# Patient Record
Sex: Male | Born: 1955 | Race: Asian | Hispanic: No | Marital: Single | Smoking: Never smoker
Health system: Southern US, Community
[De-identification: ages and names within clinical notes are randomized; demographics above are authoritative.]

## PROBLEM LIST (undated history)

## (undated) DIAGNOSIS — Z941 Heart transplant status: Secondary | ICD-10-CM

## (undated) DIAGNOSIS — G1221 Amyotrophic lateral sclerosis: Secondary | ICD-10-CM

## (undated) DIAGNOSIS — I509 Heart failure, unspecified: Secondary | ICD-10-CM

## (undated) DIAGNOSIS — I10 Essential (primary) hypertension: Secondary | ICD-10-CM

## (undated) HISTORY — PX: HEART TRANSPLANT: SHX268

## (undated) HISTORY — PX: ORCHIECTOMY: SHX2116

---

## 2009-07-01 ENCOUNTER — Inpatient Hospital Stay (HOSPITAL_COMMUNITY): Admission: EM | Admit: 2009-07-01 | Discharge: 2009-07-02 | Payer: Self-pay | Admitting: Emergency Medicine

## 2009-07-01 ENCOUNTER — Ambulatory Visit: Payer: Self-pay | Admitting: Pulmonary Disease

## 2009-07-02 ENCOUNTER — Encounter: Payer: Self-pay | Admitting: Cardiovascular Disease

## 2009-07-03 ENCOUNTER — Ambulatory Visit (HOSPITAL_COMMUNITY): Admission: RE | Admit: 2009-07-03 | Discharge: 2009-07-03 | Payer: Self-pay | Admitting: Pulmonary Disease

## 2009-08-22 ENCOUNTER — Emergency Department (HOSPITAL_COMMUNITY): Admission: EM | Admit: 2009-08-22 | Discharge: 2009-08-22 | Payer: Self-pay | Admitting: Emergency Medicine

## 2010-02-08 ENCOUNTER — Emergency Department (HOSPITAL_COMMUNITY): Admission: EM | Admit: 2010-02-08 | Discharge: 2010-02-09 | Payer: Self-pay | Admitting: Emergency Medicine

## 2010-02-11 ENCOUNTER — Emergency Department (HOSPITAL_COMMUNITY): Admission: EM | Admit: 2010-02-11 | Discharge: 2010-02-11 | Payer: Self-pay | Admitting: Emergency Medicine

## 2010-02-22 ENCOUNTER — Emergency Department (HOSPITAL_COMMUNITY): Admission: EM | Admit: 2010-02-22 | Discharge: 2010-02-23 | Payer: Self-pay | Admitting: Emergency Medicine

## 2010-03-30 ENCOUNTER — Emergency Department (HOSPITAL_COMMUNITY): Admission: EM | Admit: 2010-03-30 | Discharge: 2010-03-30 | Payer: Self-pay | Admitting: Emergency Medicine

## 2010-05-01 ENCOUNTER — Emergency Department (HOSPITAL_COMMUNITY): Admission: EM | Admit: 2010-05-01 | Discharge: 2010-05-01 | Payer: Self-pay | Admitting: Emergency Medicine

## 2011-01-23 LAB — CULTURE, BLOOD (ROUTINE X 2): Culture: NO GROWTH

## 2011-01-23 LAB — CBC
HCT: 42.6 % (ref 39.0–52.0)
MCH: 27.9 pg (ref 26.0–34.0)
MCHC: 32.9 g/dL (ref 30.0–36.0)
MCV: 84.8 fL (ref 78.0–100.0)
Platelets: 182 10*3/uL (ref 150–400)
RBC: 5.02 MIL/uL (ref 4.22–5.81)
RDW: 18.7 % — ABNORMAL HIGH (ref 11.5–15.5)
WBC: 8.2 10*3/uL (ref 4.0–10.5)

## 2011-01-23 LAB — URINALYSIS, ROUTINE W REFLEX MICROSCOPIC
Hgb urine dipstick: NEGATIVE
Nitrite: NEGATIVE
Specific Gravity, Urine: 1.013 (ref 1.005–1.030)
Urobilinogen, UA: 2 mg/dL — ABNORMAL HIGH (ref 0.0–1.0)

## 2011-01-23 LAB — TROPONIN I: Troponin I: 0.03 ng/mL (ref 0.00–0.06)

## 2011-01-23 LAB — DIFFERENTIAL: Lymphs Abs: 0.5 10*3/uL — ABNORMAL LOW (ref 0.7–4.0)

## 2011-01-23 LAB — URINE CULTURE
Colony Count: NO GROWTH
Culture: NO GROWTH

## 2011-01-23 LAB — BASIC METABOLIC PANEL
BUN: 26 mg/dL — ABNORMAL HIGH (ref 6–23)
CO2: 28 mEq/L (ref 19–32)
Calcium: 9.2 mg/dL (ref 8.4–10.5)
Creatinine, Ser: 1.81 mg/dL — ABNORMAL HIGH (ref 0.4–1.5)
Sodium: 138 mEq/L (ref 135–145)

## 2011-01-23 LAB — PROTIME-INR: Prothrombin Time: 21.5 seconds — ABNORMAL HIGH (ref 11.6–15.2)

## 2011-01-24 LAB — CBC
HCT: 41.8 % (ref 39.0–52.0)
Hemoglobin: 13.8 g/dL (ref 13.0–17.0)
MCV: 84 fL (ref 78.0–100.0)
RBC: 4.98 MIL/uL (ref 4.22–5.81)
RDW: 18.5 % — ABNORMAL HIGH (ref 11.5–15.5)
WBC: 15.1 10*3/uL — ABNORMAL HIGH (ref 4.0–10.5)

## 2011-01-24 LAB — DIFFERENTIAL
Basophils Absolute: 0 10*3/uL (ref 0.0–0.1)
Basophils Relative: 0 % (ref 0–1)
Eosinophils Absolute: 0 10*3/uL (ref 0.0–0.7)
Eosinophils Relative: 0 % (ref 0–5)
Lymphocytes Relative: 3 % — ABNORMAL LOW (ref 12–46)
Lymphs Abs: 0.4 10*3/uL — ABNORMAL LOW (ref 0.7–4.0)
Monocytes Absolute: 1.2 10*3/uL — ABNORMAL HIGH (ref 0.1–1.0)

## 2011-01-24 LAB — POCT I-STAT, CHEM 8
BUN: 26 mg/dL — ABNORMAL HIGH (ref 6–23)
Calcium, Ion: 0.92 mmol/L — ABNORMAL LOW (ref 1.12–1.32)
Chloride: 105 mEq/L (ref 96–112)
Creatinine, Ser: 2.2 mg/dL — ABNORMAL HIGH (ref 0.4–1.5)
Glucose, Bld: 102 mg/dL — ABNORMAL HIGH (ref 70–99)
TCO2: 32 mmol/L (ref 0–100)

## 2011-01-24 LAB — POCT CARDIAC MARKERS: Troponin i, poc: <0.05 ng/mL (ref 0.00–0.09)

## 2011-01-25 LAB — COMPREHENSIVE METABOLIC PANEL
AST: 55 U/L — ABNORMAL HIGH (ref 0–37)
Albumin: 3.4 g/dL — ABNORMAL LOW (ref 3.5–5.2)
Alkaline Phosphatase: 115 U/L (ref 39–117)
BUN: 29 mg/dL — ABNORMAL HIGH (ref 6–23)
CO2: 28 mEq/L (ref 19–32)
Chloride: 103 mEq/L (ref 96–112)
Creatinine, Ser: 1.85 mg/dL — ABNORMAL HIGH (ref 0.4–1.5)
GFR calc non Af Amer: 38 mL/min — ABNORMAL LOW (ref >60)
Potassium: 5.2 mEq/L — ABNORMAL HIGH (ref 3.5–5.1)
Total Bilirubin: 0.9 mg/dL (ref 0.3–1.2)

## 2011-01-25 LAB — DIFFERENTIAL
Basophils Absolute: 0 10*3/uL (ref 0.0–0.1)
Basophils Relative: 0 % (ref 0–1)
Eosinophils Relative: 0 % (ref 0–5)
Lymphocytes Relative: 4 % — ABNORMAL LOW (ref 12–46)
Monocytes Absolute: 0.3 10*3/uL (ref 0.1–1.0)
Neutro Abs: 9.7 10*3/uL — ABNORMAL HIGH (ref 1.7–7.7)

## 2011-01-25 LAB — CBC
HCT: 41 % (ref 39.0–52.0)
Hemoglobin: 13.7 g/dL (ref 13.0–17.0)
MCV: 86.9 fL (ref 78.0–100.0)
Platelets: 146 10*3/uL — ABNORMAL LOW (ref 150–400)
RBC: 4.72 MIL/uL (ref 4.22–5.81)
WBC: 10.4 10*3/uL (ref 4.0–10.5)

## 2011-01-25 LAB — PROTIME-INR: Prothrombin Time: 19.7 seconds — ABNORMAL HIGH (ref 11.6–15.2)

## 2011-01-25 LAB — POCT CARDIAC MARKERS: Troponin i, poc: <0.05 ng/mL (ref 0.00–0.09)

## 2011-01-26 LAB — CBC
HCT: 43.7 % (ref 39.0–52.0)
Hemoglobin: 14.2 g/dL (ref 13.0–17.0)
MCHC: 32.6 g/dL (ref 30.0–36.0)
MCV: 85.3 fL (ref 78.0–100.0)
MCV: 86.6 fL (ref 78.0–100.0)
Platelets: 173 10*3/uL (ref 150–400)
RBC: 5.04 MIL/uL (ref 4.22–5.81)
WBC: 9 10*3/uL (ref 4.0–10.5)
WBC: 9.9 10*3/uL (ref 4.0–10.5)

## 2011-01-26 LAB — DIFFERENTIAL
Eosinophils Absolute: 0 10*3/uL (ref 0.0–0.7)
Eosinophils Absolute: 0 10*3/uL (ref 0.0–0.7)
Eosinophils Relative: 0 % (ref 0–5)
Lymphs Abs: 0.3 10*3/uL — ABNORMAL LOW (ref 0.7–4.0)
Lymphs Abs: 0.6 10*3/uL — ABNORMAL LOW (ref 0.7–4.0)
Monocytes Absolute: 0.5 10*3/uL (ref 0.1–1.0)
Monocytes Relative: 6 % (ref 3–12)
Neutro Abs: 9.3 10*3/uL — ABNORMAL HIGH (ref 1.7–7.7)
Neutrophils Relative %: 94 % — ABNORMAL HIGH (ref 43–77)

## 2011-01-26 LAB — POCT I-STAT, CHEM 8
Chloride: 107 mEq/L (ref 96–112)
Creatinine, Ser: 1.6 mg/dL — ABNORMAL HIGH (ref 0.4–1.5)
HCT: 48 % (ref 39.0–52.0)
Hemoglobin: 16.3 g/dL (ref 13.0–17.0)
Potassium: 4.3 mEq/L (ref 3.5–5.1)
Sodium: 140 mEq/L (ref 135–145)

## 2011-01-26 LAB — POCT CARDIAC MARKERS
CKMB, poc: 1 ng/mL (ref 1.0–8.0)
CKMB, poc: 1.1 ng/mL (ref 1.0–8.0)
Myoglobin, poc: 36.8 ng/mL (ref 12–200)
Myoglobin, poc: 39.5 ng/mL (ref 12–200)
Myoglobin, poc: 51.3 ng/mL (ref 12–200)
Troponin i, poc: <0.05 ng/mL (ref 0.00–0.09)

## 2011-01-26 LAB — BASIC METABOLIC PANEL
CO2: 26 mEq/L (ref 19–32)
Chloride: 104 mEq/L (ref 96–112)
GFR calc Af Amer: 49 mL/min — ABNORMAL LOW (ref >60)
Potassium: 4.2 mEq/L (ref 3.5–5.1)

## 2011-02-10 LAB — PROTIME-INR
INR: 1.85 — ABNORMAL HIGH (ref 0.00–1.49)
Prothrombin Time: 21.2 s — ABNORMAL HIGH (ref 11.6–15.2)

## 2011-02-12 LAB — POCT I-STAT, CHEM 8
BUN: 25 mg/dL — ABNORMAL HIGH (ref 6–23)
BUN: 36 mg/dL — ABNORMAL HIGH (ref 6–23)
Calcium, Ion: 1.12 mmol/L (ref 1.12–1.32)
Chloride: 110 mEq/L (ref 96–112)
Creatinine, Ser: 1.4 mg/dL (ref 0.4–1.5)
Glucose, Bld: 106 mg/dL — ABNORMAL HIGH (ref 70–99)
HCT: 51 % (ref 39.0–52.0)
Sodium: 143 mEq/L (ref 135–145)
TCO2: 18 mmol/L (ref 0–100)
TCO2: 28 mmol/L (ref 0–100)

## 2011-02-12 LAB — CBC
HCT: 41.8 % (ref 39.0–52.0)
HCT: 43.3 % (ref 39.0–52.0)
HCT: 51 % (ref 39.0–52.0)
Hemoglobin: 13.6 g/dL (ref 13.0–17.0)
MCHC: 32.7 g/dL (ref 30.0–36.0)
MCV: 87.9 fL (ref 78.0–100.0)
Platelets: 114 10*3/uL — ABNORMAL LOW (ref 150–400)
Platelets: 124 10*3/uL — ABNORMAL LOW (ref 150–400)
RBC: 4.71 MIL/uL (ref 4.22–5.81)
RDW: 14.3 % (ref 11.5–15.5)
RDW: 14.4 % (ref 11.5–15.5)
RDW: 14.4 % (ref 11.5–15.5)
WBC: 10.5 10*3/uL (ref 4.0–10.5)

## 2011-02-12 LAB — BASIC METABOLIC PANEL
BUN: 23 mg/dL (ref 6–23)
CO2: 15 mEq/L — ABNORMAL LOW (ref 19–32)
CO2: 15 mEq/L — ABNORMAL LOW (ref 19–32)
Chloride: 102 mEq/L (ref 96–112)
Chloride: 112 mEq/L (ref 96–112)
GFR calc Af Amer: 53 mL/min — ABNORMAL LOW (ref >60)
GFR calc Af Amer: 53 mL/min — ABNORMAL LOW (ref >60)
GFR calc non Af Amer: 41 mL/min — ABNORMAL LOW (ref >60)
GFR calc non Af Amer: 44 mL/min — ABNORMAL LOW (ref >60)
Glucose, Bld: 220 mg/dL — ABNORMAL HIGH (ref 70–99)
Potassium: 3 mEq/L — ABNORMAL LOW (ref 3.5–5.1)
Potassium: 3.5 mEq/L (ref 3.5–5.1)
Potassium: 4.2 mEq/L (ref 3.5–5.1)
Sodium: 127 mEq/L — ABNORMAL LOW (ref 135–145)
Sodium: 138 mEq/L (ref 135–145)
Sodium: 140 mEq/L (ref 135–145)

## 2011-02-12 LAB — GLUCOSE, CAPILLARY: Glucose-Capillary: 115 mg/dL — ABNORMAL HIGH (ref 70–99)

## 2011-02-12 LAB — BLOOD GAS, ARTERIAL
Acid-base deficit: 10.4 mmol/L — ABNORMAL HIGH (ref 0.0–2.0)
Acid-base deficit: 5.4 mmol/L — ABNORMAL HIGH (ref 0.0–2.0)
Acid-base deficit: 8.9 mmol/L — ABNORMAL HIGH (ref 0.0–2.0)
Bicarbonate: 16.4 mEq/L — ABNORMAL LOW (ref 20.0–24.0)
Drawn by: 277341
Drawn by: 31606
Drawn by: 316061
Drawn by: 316061
FIO2: 1 %
FIO2: 1 %
MECHVT: 600 mL
MECHVT: 600 mL
O2 Saturation: 97 %
O2 Saturation: 99.6 %
PEEP: 5 cmH2O
PEEP: 8 cmH2O
Patient temperature: 98.6
RATE: 15 resp/min
RATE: 24 resp/min
RATE: 24 resp/min
RATE: 24 resp/min
pCO2 arterial: 23.2 mmHg — ABNORMAL LOW (ref 35.0–45.0)
pCO2 arterial: 32.1 mmHg — ABNORMAL LOW (ref 35.0–45.0)
pH, Arterial: 7.385 (ref 7.350–7.450)
pH, Arterial: 7.421 (ref 7.350–7.450)
pO2, Arterial: 89.2 mmHg (ref 80.0–100.0)

## 2011-02-12 LAB — CULTURE, BLOOD (ROUTINE X 2)

## 2011-02-12 LAB — DIFFERENTIAL
Basophils Absolute: 0 10*3/uL (ref 0.0–0.1)
Lymphocytes Relative: 17 % (ref 12–46)
Lymphs Abs: 0.9 10*3/uL (ref 0.7–4.0)
Monocytes Relative: 4 % (ref 3–12)

## 2011-02-12 LAB — TROPONIN I: Troponin I: 0.89 ng/mL (ref 0.00–0.06)

## 2011-02-12 LAB — POCT CARDIAC MARKERS: Troponin i, poc: <0.05 ng/mL (ref 0.00–0.09)

## 2011-02-12 LAB — HEMOGLOBIN A1C: Mean Plasma Glucose: 131 mg/dL

## 2011-02-12 LAB — URINE CULTURE: Colony Count: NO GROWTH

## 2011-02-12 LAB — HEPARIN LEVEL (UNFRACTIONATED)
Heparin Unfractionated: 0.62 IU/mL (ref 0.30–0.70)
Heparin Unfractionated: 0.65 IU/mL (ref 0.30–0.70)

## 2011-02-12 LAB — PROTIME-INR
INR: 1.5 (ref 0.00–1.49)
INR: 1.5 (ref 0.00–1.49)
Prothrombin Time: 17.8 seconds — ABNORMAL HIGH (ref 11.6–15.2)
Prothrombin Time: 17.8 seconds — ABNORMAL HIGH (ref 11.6–15.2)
Prothrombin Time: 18.3 seconds — ABNORMAL HIGH (ref 11.6–15.2)

## 2011-02-12 LAB — URINALYSIS, MICROSCOPIC ONLY
Bilirubin Urine: NEGATIVE
Glucose, UA: NEGATIVE mg/dL
Specific Gravity, Urine: 1.017 (ref 1.005–1.030)

## 2011-02-12 LAB — COMPREHENSIVE METABOLIC PANEL
BUN: 25 mg/dL — ABNORMAL HIGH (ref 6–23)
CO2: 27 mEq/L (ref 19–32)
Chloride: 103 mEq/L (ref 96–112)
Creatinine, Ser: 1.61 mg/dL — ABNORMAL HIGH (ref 0.4–1.5)
GFR calc non Af Amer: 45 mL/min — ABNORMAL LOW (ref >60)
Glucose, Bld: 113 mg/dL — ABNORMAL HIGH (ref 70–99)
Total Bilirubin: 1 mg/dL (ref 0.3–1.2)

## 2011-02-12 LAB — URINE DRUGS OF ABUSE SCREEN W ALC, ROUTINE (REF LAB)
Cocaine Metabolites: NEGATIVE
Creatinine,U: 85.5 mg/dL
Ethyl Alcohol: <10 mg/dL (ref <10)
Opiate Screen, Urine: NEGATIVE
Phencyclidine (PCP): NEGATIVE

## 2011-02-12 LAB — HEPATIC FUNCTION PANEL
AST: 65 U/L — ABNORMAL HIGH (ref 0–37)
Bilirubin, Direct: 0.2 mg/dL (ref 0.0–0.3)
Indirect Bilirubin: 0.5 mg/dL (ref 0.3–0.9)
Total Protein: 4.9 g/dL — ABNORMAL LOW (ref 6.0–8.3)

## 2011-02-12 LAB — BRAIN NATRIURETIC PEPTIDE
Pro B Natriuretic peptide (BNP): 616 pg/mL — ABNORMAL HIGH (ref 0.0–100.0)
Pro B Natriuretic peptide (BNP): 657 pg/mL — ABNORMAL HIGH (ref 0.0–100.0)

## 2011-02-12 LAB — POCT I-STAT 3, VENOUS BLOOD GAS (G3P V)
pCO2, Ven: 58.6 mmHg — ABNORMAL HIGH (ref 45.0–50.0)
pH, Ven: 7.198 — CL (ref 7.250–7.300)

## 2011-02-12 LAB — LEGIONELLA ANTIGEN, URINE

## 2011-02-12 LAB — CREATININE, SERUM
Creatinine, Ser: 1.52 mg/dL — ABNORMAL HIGH (ref 0.4–1.5)
GFR calc Af Amer: 58 mL/min — ABNORMAL LOW (ref >60)

## 2011-02-12 LAB — POCT I-STAT 3, ART BLOOD GAS (G3+)
Acid-base deficit: 11 mmol/L — ABNORMAL HIGH (ref 0.0–2.0)
O2 Saturation: 100 %
pO2, Arterial: 250 mmHg — ABNORMAL HIGH (ref 80.0–100.0)

## 2011-02-12 LAB — APTT
aPTT: 196 seconds — ABNORMAL HIGH (ref 24–37)
aPTT: 26 seconds (ref 24–37)

## 2011-02-12 LAB — CARDIAC PANEL(CRET KIN+CKTOT+MB+TROPI)
Relative Index: 2.8 — ABNORMAL HIGH (ref 0.0–2.5)
Total CK: 248 U/L — ABNORMAL HIGH (ref 7–232)
Troponin I: 0.67 ng/mL (ref 0.00–0.06)

## 2011-02-12 LAB — TSH: TSH: 2.521 u[IU]/mL (ref 0.350–4.500)

## 2011-02-12 LAB — STREP PNEUMONIAE URINARY ANTIGEN: Strep Pneumo Urinary Antigen: NEGATIVE

## 2011-02-12 LAB — MAGNESIUM: Magnesium: 1.4 mg/dL — ABNORMAL LOW (ref 1.5–2.5)

## 2011-02-12 LAB — T3, FREE: T3, Free: 3 pg/mL (ref 2.3–4.2)

## 2011-03-22 NOTE — Cardiovascular Report (Signed)
[name], [name]             ACCOUNT NO.:  [id number]   MEDICAL RECORD NO.:  [id number]          PATIENT TYPE:  INP   LOCATION:  2912                         FACILITY:  [facility]   PHYSICIAN:  Nanetta Batty, M.D.   DATE OF BIRTH:  03/03/1956   DATE OF PROCEDURE:  DATE OF DISCHARGE:  07/02/2009                            CARDIAC CATHETERIZATION   [name] is a 55 year old gentleman admitted yesterday through the  emergency room with incessant VT and cardiogenic shock.  He was shocked  multiple times and ultimately was brought to the Cath Lab where  diagnostic coronary angiography was performed by Dr. Tresa Endo revealing  essentially normal coronary arteries with severe LV dysfunction and EF  less than 10%.  Intraaortic balloon pump was placed as was Swan-Ganz  catheter.  Ultimately, his ventricular tachyarrhythmias abated and he  has been sedated and intubated, on pressors.  The intraaortic balloon  pump unfortunately malfunctioned last night and had been removed.  The  patient was accepted in transfer through [facility] for consideration of LVAD and transplant.  This balloon  pump is being reinserted for hemodynamic stability during transport.   DESCRIPTION OF PROCEDURE:  The patient was brought to the Second Floor  [facility] Cardiac Cath Lab intubated.  His right groin was prepped  sterilely.  The Swan-Ganz catheter was remained in place.  Double glove  technique was used.  An 0.035 J-wire was used to withdraw the previously  placed balloon pump sheath and this was replaced with a new 7.5 [place]  sheath.  A 40-cm long intraaortic balloon pump was inserted and  fluoroscopically placed at the level of the carina.  One-to-one counter  pulsation was then initiated with an augmented pressure in the 130  range.  The patient tolerated the procedure well.  There were no  complications.   IMPRESSION:  Successful reintroduction of intraaortic balloon  pump via  the previous puncture with sheath exchange using double glove sterile  technique.  The patient received a gram of Rocephin prophylactically.  He will be transported to [facility] for further treatment.      Nanetta Batty, M.D.  Electronically Signed     JB/MEDQ  D:  07/02/2009  T:  07/03/2009  Job:  [id number]   cc:   Second Floor [facility]  [facility]

## 2011-03-22 NOTE — Discharge Summary (Signed)
[name], [name]             ACCOUNT NO.:  [id number]   MEDICAL RECORD NO.:  [id number]          PATIENT TYPE:  INP   LOCATION:  2912                         FAC[name]   PHYSICIAN:  Reginald Iba, MD   DATE OF BIRTH:  [birth year]   DATE OF ADMISSION:  07/01/2009  DATE OF DISCHARGE:  07/02/2009                               DISCHARGE SUMMARY   DISCHARGE DIAGNOSIS:  1. Acute nonischemic cardiomyopathy with ejection  approximately 10%.  2. Sustained ventricular tachycardia with cardioversion and multiple      shocks, maintaining sinus with amiodarone drip.  3. Respiratory failure with intubation and ventilator with FiO2 of      40%, PEEP of 5 cm, tidal volume 600 mL and vent rate is 15.  4. Hypotensive on multiple pressors, Levophed and dobutamine.  5. Normal coronary arteries.  6. History of gastroesophageal reflux disease.  7. History of normal coronary arteries and ejection fraction 50-55% at      [name] December 09, 2008 by Dr. Nolberto Krueger.  8. Intra-aortic balloon pump via right femoral artery one-to-one      augmentation  9. Left femoral and femoral arterial line for blood pressure and blood      draws.   CONSULTATIONS:  Dr. Sherryl Krueger and Dr. Johney Krueger for electrophysiology  and critical care medicine for ventilator management.   DISCHARGE CONDITION:  Critical, family is aware of critical status.   HISTORY OF PRESENT ILLNESS:  A 55 year old gentleman was admitted to the  emergency room at [name], presented with complaints of chest pain  and tachycardia.   The patient was found to be in ventricular tachycardia.  IV fluids were  started and he underwent carotid massage with conversion to sinus  rhythm.   The patient's recent history includes in February he was having  tachycardia.  He went to cardiology in [name] and underwent  cardiac catheterization.  He did wear a monitor at that time but he did  not believe the tachycardia was found on the  monitor.  Usually his chest  burning would occur with the tachycardia.  The patient did well after  the cardiac catheterization in February until the beginning of August  other than a rare episode of tachycardia.  At the beginning of August,  he developed more runs of tachycardia.  He would have chest burning and  into both arms.  He would feel weak and tired with these episodes.  Sometimes the episodes lasted all day long.  The patient saw his primary  care physician because he was also developing some swelling in his lower  extremities and was put on Lasix 40 mg in addition to his Toprol and  Protonix, Toprol was 25 mg.  The patient continued with episodes.  They  attempted to get a cardiology appointment and that was to be done on  August 29, 2009.   On the day of arrival a [name], it was worsening symptoms.  He had  been at work.  He felt bad and came to the emergency room.  After the  initial conversion with carotid massage to  sinus, he went back into V-  tach with rates of 158, initially was 166.  He was started on an IV  amiodarone bolus.  His blood pressure dropped into the 80s and the  amiodarone bolus was discontinued.  The patient was alert and pressure  actually maintained in the V-tach prior to the amiodarone initially.  The patient was given 75 mg bolus of lidocaine with no change in his  arrhythmia.  He was given 5 mg IV Lopressor with no change in arrhythmia  and blood pressures at that time were around 100.  He became nauseated.  He was feeling very lightheaded, having flashing lights in front of his  eyes as well and the chest discomfort would come and go.  We attempted  another amiodarone 150 mg given much slower but blood pressure continued  to drop.  He was given as etomidate 12 mg and succinylcholine  100 mg  and was cardioverted into a severe bradycardia with rate 20.  Slowly as  heart rate came back up.  He would continue with episodes of ventricular   tachycardia.  His respirations were slow to respond it was felt due to  the severity of his condition.  He was intubated and placed on a  respirator.   The patient then had no blood pressure medicine or very low and CPR was  started which occurred for between 1 to 5 minutes of CPR.  He was given  epinephrine and he was given dobutamine drip and calcium chloride.  Then  a dopamine drip was started.  The cardiac cath lab was notified.  The  patient was transported to the cath lab for intra-aortic balloon pump.  There he was in V-tach at least five different times and shocked into  sinus rhythm.  The patient was given sedation, and Swan-Ganz catheter  was placed as well.   Additionally, Dr. Tresa Endo did do a heart catheterization as well and he  was found to have essentially normal coronary arteries.  Initial LV  readings were 58/28.   Please note, so we had done a quick look echo in the emergency room.  EF  in V-tach appeared to be 10%.  In the sinus rhythm, it did appear to be  20% but this was not a complete 2-D echo.   The patient was taken from the cath lab to the CCU and continued on  balloon pump and ventilator with critical care management.  He was on IV  dopamine and dobutamine, eventually this was changed to norepinephrine  as well as fentanyl and Versed for sedation and the amiodarone continued  during the night.  He received several boluses of IV amiodarone  secondary to ventricular tachycardia.   At one point, thought was to evaluate for lidocaine.  EP was consulted.  They felt amiodarone was the drug of choice for this patient.   The patient maintained during the night with blood pressures in the 90s  to low 100s systolic and by our arterial line.   At time of discharge, intra-aortic balloon pump will be placed.  Vital  Signs: Currently blood pressure 103/58 with pulse 74, temperature 96.8  and that is core temperature, augmented systolic pressure 90, augmenting   diastolic 49 and PA pressure 25 systolic, 17 diastolic and a mean of 21.  It has been difficult to wedge the patient on Swan and the Ernestine Conrad is  measured at 65 cm right femoral artery.  At 9:36, cardiac output is  2.99, heart  rate 82, cardiac index 1.62 and CVP 26.  Copies will be sent  with the patient.   MEDICATIONS:  IV amiodarone is at 60 mg an hour.  IV dobutamine is at 20  mcg per kilo per minute and norepinephrine instead 18 mcg per minute.  IV fentanyl is at 50 mcg per hour.  Heparin as at 700 units an hour and  Versed is at 4 mg an hour.  IV fluids:  D5W with 150 mEq of bicarb is at  75 mL an hour, normal saline at 30 mL an hour.  I and O August 25:  The  patient probably received 4000 of saline in the emergency room and that  is not included in this I and O but intake was 17, 46 output was 405 for  a positive 1341 and on August 26 intake 1341, output 167, positive 1174  at this time.   LABORATORY:  On initial cardiac marker, MB was 1.8, troponin less than  0.05, myoglobin 68 and follow up CK 112, MB 3.5, troponin 0.89 and  follow up in 8 hours, CK 248, MB 7, troponin I 0.67.  The next CK 546,  MB 10.6, troponin I 0.66.  His enzymes up not due to coronary stenosis  but due to probable myocarditis and numerous shocks.  Initial potassium  was 5.4 with a creatinine of 1.6.  After fluids were given, sodium 140,  potassium 5.1, chloride 103, CO2 27, glucose 113, BUN 25, creatinine 1.6  and on LFTs, SGOT was 48.  Again after fluids, potassium dropped to 3.3,  chloride 110, BUN 25 and creatinine 1.4 and then cardiac catheterization  was done.  Currently basic metabolic panel at 4:00 a.m. sodium 138,  potassium 4.2, chloride 111, CO2 15, glucose 220, BUN 21, creatinine  1.74.  Magnesium at 4:00 a.m. on 08/26 2.0, phosphorus 1.7, heparin  level was 0.62.  Drugs of abuse screen was negative.  Most recent blood  gas at 10:00 a.m. on 40% FiO2:  pH 7.421, pCO2 25.8 pO2 92.2, bicarb  16.4,  total CO2 17.2, oxygen saturation 97%.  BNP done August 26 of 616.  PTT 196, pro time 18.3 with INR of 1.5.   Glycohemoglobin is elevated at 6.2.  CBC on August 26 at 4 a.m.: WBC is  elevated 10.6, hemoglobin 13.6, hematocrit 41.8 and platelets 104.   T3 is 3.0.  TSH is 2.521.  He had a strep pneumonia of urine which was  negative.  UA was essentially negative, urobilinogen 0.2 protein 30.   RADIOLOGY:  Initial chest x-ray in the emergency room prior to  intubation:  No congestive heart failure or pneumonia, heart size upper  normal considering AP projection.  Follow up after intubation:  ET tube  is in place, minimal interstitial prominence bilaterally suspicious for  mild interstitial edema and no acute infiltrate or focal consolidation.  No pneumothorax and there was an NG in place and then the evening of  August 25, ET tube was stable and NG stable.  A femoral placed Swan-Ganz  catheter projects into the proximal right pulmonary artery.  Intra-  aortic balloon with its marker 8 mm inferior to the top of aortic arch.  Cardiomegaly and mild pulmonary edema appear stable.  No pneumothorax or  significant pleural effusion and then at August 26 at 5:54 a.m.  pulmonary vascular congestion and actually pulmonary edema is improved.  Balloon pump was in unchanged position, Swan-Ganz in the right main  pulmonary artery segment  and ET tube was positioned satisfactorily and  NG was satisfactory.  2-D echo has been done.   Please note for family history, no coronary disease.  One nephew has had  a rhythm problem per the family and received ICD.   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  His brothers  are quite close with the patient though they do not live with them.  He  is single.  He has a sister who is well.  His  three siblings appear to  be healthy.  The patient works for the [employer] and has worked  through most of his tachycardia through the month of August though  he  has felt very bad with it.   Outpatient meds:  Toprol XL 25 mg daily, Protonix 40 mg daily, Lasix 40  mg daily.   Other history only included gastroesophageal reflux disease and  hypertension.   Please note the patient denied any recent colds or fevers or viral  syndrome as well as his family denied that he had any illnesses.  His  sister relates over a year ago he did have shingles ,otherwise were  negative.   Please note at the time of transfer to [facility], the patient does not  follow commands when he is awakened from his sedation   Please note Dr. Garen Lah talked with the sister and one of his brothers  at length about the severity of his condition, that our hope is to  transfer him to [facility] for further management but that he is gravely  ill.  They are aware of this issue.   Please note as well the patient does have thrombocytopenia and  hyperglycemia and we will manage that prior to discharge.      Darcella Gasman. Annie Paras, N.P.      [facility] Iba, MD  Electronically Signed    LRI/MEDQ  D:  07/02/2009  T:  07/02/2009  Job:  [id number]   cc:   Dr. Kathryne Hitch

## 2011-03-22 NOTE — Cardiovascular Report (Signed)
[name], [name]             ACCOUNT NO.:  [id number]   MEDICAL RECORD NO.:  [id number]          PATIENT TYPE:  INP   LOCATION:  2912                         FACILITY:  [facility]   PHYSICIAN:  Nicki Guadalajara, M.D.     DATE OF BIRTH:  08-Feb-1956   DATE OF PROCEDURE:  DATE OF DISCHARGE:                            CARDIAC CATHETERIZATION   INDICATIONS:  [name] is a 55 year old gentleman who  apparently had undergone cardiac catheterization earlier this year at  [facility] where he was found to have normal LV function and  normal coronary arteries.  Apparently, he presented to the emergency  room today and was found to have sustained ventricular tachycardia,  shortness of breath, hypotension.  He was evaluated by Dr. Mariah Milling and  with poor perfusion with his VT.  He ultimately required, I do not know  despite amiodarone, he required transient CPR with chest compressions,  multiple countershocks.  He also was started on atrophic pressure  report, was initially dobutamine and subsequently dopamine for continued  hypotension.  A curbside brief echo assessment portable, suggested  severe LV dysfunction with an EF of approximately 10%.  He is now taken  emergently to the cardiac catheterization.   LABORATORY:  Due to his persistent hypotension despite anatrophic  therapy and continued recurrent VT.   In the emergency room, the patient also had been intubated.  He received  Versed for sedation.  His right femoral artery was punctured  anteriorly  and a balloon pump sheath was inserted and a 40-cm linear intra-aortic  balloon pump was successfully advanced into the aorta and positioned  just below the aortic knob.  Counterpulsation was begun.  Initial  pressure was 60/40.  Initial augmentation of blood pressure increased  this to 73 mm.  The patient was still on dobutamine and dopamine.  He  continued to develop recurrent episodes of ventricular tachycardia.  He  received  at least four additional shocks in the lab.  He also received,  I believe additional amiodarone and an additional 100 mg lidocaine  bolus.  His left femoral artery was punctured anteriorly and selective  angiography of native coronary arteries were performed to exclude an  ischemic etiology.  His right femoral vein was punctured and an 8-French  venous sheath was inserted.  Swan-Ganz catheterization was then  performed with hemodynamic monitoring in the RA, RV, PA and PC  positions.  O2 saturation was obtained in the central AO as well as in  the pulmonary artery for Fick cardiac output determination.  Ultimately,  at the end of the procedure following continued intra-aortic balloon  counterpulsation and central aortic pressure, finally started to  increase such that his augmented pressure had risen to approximately 100-  110 mm.  In addition, because of his initial recurrent incessant VT, EP  evaluation was also performed by Dr. Graciela Husbands.  The patient will be  transported to the coronary care unit.   Initial hemodynamic, central aortic pressure was approximately 60/40.  Initial augmentation of pressure was 73 mm systolically.   Left ventricular pressure was 58/28.   ABG was 7.16,  CO2 47, pO2 250 and bicarbonate 17.  He also received 2  amp of sodium bicarbonate.   Swan-Ganz catheterization showed a RA pressure, mean of 30, A-wave 34,  pulmonary artery pressure was difficult to read due to marked lability  in the recording.  Pulmonary capillary wedge pressure mean was 35.  The  patient left the cardiac catheterization laboratory, intubated, sedated  with augmented systolic blood pressure now 120 mm.   IMPRESSION:  1. Incessant recurrent monomorphic VT.  2. Nonischemic cardiomyopathy.  3. Cardiogenic shock, status post insertion of intra-aortic balloon      counterpulsation.  4. No significant coronary obstructive disease.           ______________________________  Nicki Guadalajara,  M.D.     TK/MEDQ  D:  07/01/2009  T:  07/02/2009  Job:  [id number]

## 2011-08-19 DIAGNOSIS — D649 Anemia, unspecified: Secondary | ICD-10-CM | POA: Diagnosis present

## 2012-02-09 DIAGNOSIS — Z941 Heart transplant status: Secondary | ICD-10-CM

## 2012-04-17 DIAGNOSIS — I10 Essential (primary) hypertension: Secondary | ICD-10-CM | POA: Diagnosis present

## 2015-01-23 DIAGNOSIS — G1221 Amyotrophic lateral sclerosis: Secondary | ICD-10-CM | POA: Diagnosis present

## 2015-05-27 ENCOUNTER — Encounter (HOSPITAL_COMMUNITY): Payer: Self-pay

## 2015-05-27 ENCOUNTER — Emergency Department (HOSPITAL_COMMUNITY): Payer: Medicare Other

## 2015-05-27 ENCOUNTER — Emergency Department (HOSPITAL_COMMUNITY)
Admission: EM | Admit: 2015-05-27 | Discharge: 2015-05-28 | Disposition: A | Discharge status: Other Acute Inpatient Hospital | Payer: Medicare Other | Attending: Emergency Medicine | Admitting: Emergency Medicine

## 2015-05-27 DIAGNOSIS — R0682 Tachypnea, not elsewhere classified: Secondary | ICD-10-CM | POA: Diagnosis not present

## 2015-05-27 DIAGNOSIS — R079 Chest pain, unspecified: Secondary | ICD-10-CM | POA: Diagnosis not present

## 2015-05-27 DIAGNOSIS — Z941 Heart transplant status: Secondary | ICD-10-CM | POA: Diagnosis not present

## 2015-05-27 DIAGNOSIS — G1221 Amyotrophic lateral sclerosis: Secondary | ICD-10-CM

## 2015-05-27 DIAGNOSIS — Z7982 Long term (current) use of aspirin: Secondary | ICD-10-CM | POA: Diagnosis not present

## 2015-05-27 DIAGNOSIS — R000 Tachycardia, unspecified: Secondary | ICD-10-CM | POA: Diagnosis not present

## 2015-05-27 DIAGNOSIS — Z79899 Other long term (current) drug therapy: Secondary | ICD-10-CM | POA: Diagnosis not present

## 2015-05-27 DIAGNOSIS — I10 Essential (primary) hypertension: Secondary | ICD-10-CM | POA: Diagnosis not present

## 2015-05-27 DIAGNOSIS — I509 Heart failure, unspecified: Secondary | ICD-10-CM | POA: Insufficient documentation

## 2015-05-27 HISTORY — DX: Essential (primary) hypertension: I10

## 2015-05-27 HISTORY — DX: Amyotrophic lateral sclerosis: G12.21

## 2015-05-27 HISTORY — DX: Heart failure, unspecified: I50.9

## 2015-05-27 HISTORY — DX: Heart transplant status: Z94.1

## 2015-05-27 LAB — CBC
HEMATOCRIT: 41.4 % (ref 39.0–52.0)
Hemoglobin: 13 g/dL (ref 13.0–17.0)
MCH: 25.8 pg — AB (ref 26.0–34.0)
MCHC: 31.4 g/dL (ref 30.0–36.0)
MCV: 82.3 fL (ref 78.0–100.0)
Platelets: 208 10*3/uL (ref 150–400)
RBC: 5.03 MIL/uL (ref 4.22–5.81)
RDW: 15.1 % (ref 11.5–15.5)
WBC: 6.6 10*3/uL (ref 4.0–10.5)

## 2015-05-27 LAB — D-DIMER, QUANTITATIVE: D-Dimer, Quant: 1.41 ug/mL-FEU — ABNORMAL HIGH (ref 0.00–0.48)

## 2015-05-27 LAB — BASIC METABOLIC PANEL
Anion gap: 9 (ref 5–15)
BUN: 23 mg/dL — AB (ref 6–20)
CO2: 31 mmol/L (ref 22–32)
CREATININE: 0.73 mg/dL (ref 0.61–1.24)
Calcium: 10.4 mg/dL — ABNORMAL HIGH (ref 8.9–10.3)
Chloride: 98 mmol/L — ABNORMAL LOW (ref 101–111)
GFR calc Af Amer: >60 mL/min (ref >60)
GFR calc non Af Amer: >60 mL/min (ref >60)
Glucose, Bld: 161 mg/dL — ABNORMAL HIGH (ref 65–99)
Potassium: 4.1 mmol/L (ref 3.5–5.1)
Sodium: 138 mmol/L (ref 135–145)

## 2015-05-27 LAB — I-STAT TROPONIN, ED: Troponin i, poc: 0 ng/mL (ref 0.00–0.08)

## 2015-05-27 MED ORDER — IOHEXOL 350 MG/ML SOLN
80.0000 mL | Freq: Once | INTRAVENOUS | Status: AC | PRN
  Administered 2015-05-27: 80 mL via INTRAVENOUS

## 2015-05-27 MED ORDER — SODIUM CHLORIDE 0.9 % IV BOLUS (SEPSIS)
500.0000 mL | Freq: Once | INTRAVENOUS | Status: AC
  Administered 2015-05-27: 500 mL via INTRAVENOUS

## 2015-05-27 NOTE — ED Notes (Signed)
MD at bedside. 

## 2015-05-27 NOTE — ED Notes (Signed)
Pt. Presents with complaint of CP intermittently today. Pt. AxO x4. Pt. 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 Pt. States his breathing has worsened over past few weeks. Pt. 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

## 2015-05-27 NOTE — ED Provider Notes (Signed)
CSN: [id number]     Arrival date & time 05/27/15  1821 History   First MD Initiated Contact with Patient 05/27/15 1908     Chief Complaint  Patient presents with  . Chest Pain     (Consider location/radiation/quality/duration/timing/severity/associated sxs/prior Treatment) HPI The patient has a history of ALS. He reports that today he has had several episodes of a sharp pain in his left anterior chest. It has been coming and going. It has resolved completely between episodes. Patient denies significant associated symptoms. He has not had fever or cough production. He does report he has had the increased heart rate and increased respiratory rate for several weeks. That has been attributed to his ALS. As a baseline since his cardiac transplant reports his heart rate has been in the 90s. For about 2 weeks now his heart rate is been the low 100s. Past Medical History  Diagnosis Date  . ALS (amyotrophic lateral sclerosis)   . Heart transplanted   . Hypertension   . CHF (congestive heart failure)    Past Surgical History  Procedure Laterality Date  . Heart transplant    . Orchiectomy      L sided   No family history on file. History  Substance Use Topics  . Smoking status: Never Smoker   . Smokeless tobacco: Not on file  . Alcohol Use: No    Review of Systems 10 Systems reviewed and are negative for acute change except as noted in the HPI.    Allergies  Review of patient's allergies indicates no known allergies.  Home Medications   Prior to Admission medications   Medication Sig Start Date End Date Taking? Authorizing Provider  amoxicillin (AMOXIL) 500 MG tablet Take 2,000 mg by mouth once. 04/14/15  Yes Historical Provider, MD  aspirin 81 MG tablet Take 81 mg by mouth every morning.   Yes Historical Provider, MD  bismuth subsalicylate (PEPTO BISMOL) 262 MG/15ML suspension Take 30 mLs by mouth every 6 (six) hours as needed for diarrhea or loose stools.    Yes Historical  Provider, MD  Calcium Carb-Cholecalciferol (CALCIUM 600/VITAMIN D3) 600-800 MG-UNIT TABS Take 1 tablet by mouth 2 (two) times daily.   Yes Historical Provider, MD  cetirizine (ZYRTEC) 10 MG tablet Take 10 mg by mouth as needed for allergies.    Yes Historical Provider, MD  diphenhydramine-acetaminophen (TYLENOL PM) 25-500 MG TABS Take 1 tablet by mouth at bedtime as needed (for sleep).    Yes Historical Provider, MD  lisinopril (PRINIVIL,ZESTRIL) 5 MG tablet Take 2.5 mg by mouth every morning. 03/23/15  Yes Historical Provider, MD  magnesium oxide (MAG-OX) 400 MG tablet Take 800 mg by mouth 3 (three) times daily. 10/13/14  Yes Historical Provider, MD  mycophenolate (CELLCEPT) 500 MG tablet Take 500 mg by mouth 2 (two) times daily. 01/27/15  Yes Historical Provider, MD  pravastatin (PRAVACHOL) 20 MG tablet Take 20 mg by mouth every evening. 10/09/14  Yes Historical Provider, MD  riluzole (RILUTEK) 50 MG tablet Take 50 mg by mouth 2 (two) times daily. 04/16/15  Yes Historical Provider, MD  tacrolimus (PROGRAF) 1 MG capsule Take 1.5 mg by mouth 2 (two) times daily.   Yes Historical Provider, MD   BP 135/63 mmHg  Pulse 103  Temp(Src) 98 F (36.7 C) (Oral)  Resp 27  Wt 115 lb (52.164 kg)  SpO2 97% Physical Exam  Constitutional: He is oriented to person, place, and time.  Patient is alert and appropriate. He has increased work  of breathing. He is not toxic with clear mental status.  HENT:  Head: Normocephalic and atraumatic.  Nose: Nose normal.  Mouth/Throat: Oropharynx is clear and moist.  Eyes: EOM are normal. Pupils are equal, round, and reactive to light.  Neck: Neck supple.  Cardiovascular: Intact distal pulses.   Tachycardia. No gross rub murmur gallop.  Pulmonary/Chest:  Patient has tachypnea and increased depth of breathing. He however is not acutely distressed. He is speaking in clear sentences. Patient attempts to take deep inspiration for auscultation, however due to his core muscle  weakness is difficult to get good air flow. I can't appreciate any gross wheeze or rhonchi.  Abdominal: Soft. He exhibits no distension. There is no tenderness.  Musculoskeletal: He exhibits no edema or tenderness.  Neurological: He is alert and oriented to person, place, and time. No cranial nerve deficit.  Patient has some diffuse weakness but is using all extremities symmetrically.  Skin: Skin is warm and dry.  Psychiatric: He has a normal mood and affect.    ED Course  Procedures (including critical care time) Labs Review Labs Reviewed  BASIC METABOLIC PANEL - Abnormal; Notable for the following:    Chloride 98 (*)    Glucose, Bld 161 (*)    BUN 23 (*)    Calcium 10.4 (*)    All other components within normal limits  CBC - Abnormal; Notable for the following:    MCH 25.8 (*)    All other components within normal limits  D-DIMER, QUANTITATIVE (NOT AT [facility]) - Abnormal; Notable for the following:    D-Dimer, Quant 1.41 (*)    All other components within normal limits  I-STAT TROPOININ, ED    Imaging Review Dg Chest 2 View  05/27/2015   CLINICAL DATA:  Left-sided chest pains  EXAM: CHEST - 2 VIEW  COMPARISON:  05/01/2010  FINDINGS: The previously seen pacing device is been removed in the interval. Postsurgical changes are now seen consisting of a median sternotomy. Lungs are clear. The cardiac shadow is stable. No acute bony abnormality is noted.  IMPRESSION: No acute abnormality seen.   Electronically Signed   By: Alcide Clever M.D.   On: 05/27/2015 19:21     EKG Interpretation   Date/Time:  Wednesday May 27 2015 18:30:51 EDT Ventricular Rate:  124 PR Interval:  117 QRS Duration: 97 QT Interval:  334 QTC Calculation: 480 R Axis:   73 Text Interpretation:  Sinus tachycardia Ventricular premature complex  Probable anteroseptal infarct, old AGREE Confirmed by Donnald Garre, MD, Lebron Conners  [id number]) on 05/27/2015 7:10:25 PM     Consultation has been made with [facility]  physician, Dr. Dot Lanes. She reports the patient had a cardiac catheterization last year and did not have any greater than 20% coronary artery disease. He also had an echocardiogram with normal EF. From [facility] transplant but there was not suspicion at this time of acute ischemic or endocarditis type of picture. She did suggest consideration for PE. If the patient however stable and diagnostic workup is negative, they will contact the patient tomorrow to establish his follow-up this week. MDM   Final diagnoses:  Chest pain, unspecified chest pain type  H/O heart transplant  ALS (amyotrophic lateral sclerosis)       Arby Barrette, MD 06/01/15 0157

## 2015-05-27 NOTE — ED Notes (Signed)
Patient transported to X-ray 

## 2015-05-27 NOTE — ED Notes (Signed)
Pt states that he doesn't not have chest pain at this time, but while in x-ray, pt reported left sided chest pain when taking a deep breath. Pt appears short of breath.

## 2015-05-28 DIAGNOSIS — I10 Essential (primary) hypertension: Secondary | ICD-10-CM | POA: Diagnosis not present

## 2015-05-28 DIAGNOSIS — Z941 Heart transplant status: Secondary | ICD-10-CM | POA: Diagnosis not present

## 2015-05-28 DIAGNOSIS — R079 Chest pain, unspecified: Secondary | ICD-10-CM | POA: Diagnosis present

## 2015-05-28 DIAGNOSIS — R0682 Tachypnea, not elsewhere classified: Secondary | ICD-10-CM | POA: Diagnosis not present

## 2015-05-28 DIAGNOSIS — I509 Heart failure, unspecified: Secondary | ICD-10-CM | POA: Diagnosis not present

## 2015-05-28 DIAGNOSIS — R000 Tachycardia, unspecified: Secondary | ICD-10-CM | POA: Diagnosis not present

## 2015-05-28 DIAGNOSIS — Z79899 Other long term (current) drug therapy: Secondary | ICD-10-CM | POA: Diagnosis not present

## 2015-05-28 DIAGNOSIS — Z7982 Long term (current) use of aspirin: Secondary | ICD-10-CM | POA: Diagnosis not present

## 2015-05-28 DIAGNOSIS — G1221 Amyotrophic lateral sclerosis: Secondary | ICD-10-CM | POA: Diagnosis not present

## 2015-05-28 LAB — BRAIN NATRIURETIC PEPTIDE: B Natriuretic Peptide: 5.5 pg/mL (ref 0.0–100.0)

## 2015-05-28 LAB — I-STAT CG4 LACTIC ACID, ED: Lactic Acid, Venous: 0.48 mmol/L — ABNORMAL LOW (ref 0.5–2.0)

## 2015-05-28 MED ORDER — DEXTROSE 5 % IV SOLN
500.0000 mg | Freq: Once | INTRAVENOUS | Status: AC
  Administered 2015-05-28: 500 mg via INTRAVENOUS
  Filled 2015-05-28: qty 500

## 2015-05-28 MED ORDER — DEXTROSE 5 % IV SOLN
1.0000 g | Freq: Once | INTRAVENOUS | Status: AC
  Administered 2015-05-28: 1 g via INTRAVENOUS
  Filled 2015-05-28: qty 10

## 2015-05-28 NOTE — ED Provider Notes (Signed)
Assuming care of patient this evening. Patient in the ED for chest pain, L sided, has pleuritic component. Currently chest pain free. 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 Pt gets care at [facility]. Pt also has ALS.  Workup thus far is + for d-dimer. PT has RR in the low 30s and HR in the 100s. CT PE shows no PE, + nodules which could be atypical infection. Pt states that he is unable to cough due to ALS. Lung exam is non focal - but he is tachypneic, tachycardic, and immuno-supressed with ALS and i think it will be best to admit him, because is he decompensates, he will go into resp failure.  [facility] called, they will accept the pt.   Derwood Kaplan, MD 05/29/15 2316

## 2015-05-28 NOTE — ED Notes (Signed)
Per CT, a CD of the pt's CT scan cannot be made at this time, but state that they will send the scan to [facility].

## 2015-05-28 NOTE — ED Notes (Signed)
[facility] contacted to tx patient to [address]; Dr. Rosanne Ashing receiving

## 2015-05-28 NOTE — Discharge Instructions (Signed)

## 2015-07-04 ENCOUNTER — Emergency Department (HOSPITAL_COMMUNITY): Payer: Medicare Other

## 2015-07-04 ENCOUNTER — Emergency Department (HOSPITAL_COMMUNITY)
Admission: EM | Admit: 2015-07-04 | Discharge: 2015-07-05 | Disposition: A | Discharge status: Other Acute Inpatient Hospital | Payer: Medicare Other | Attending: Emergency Medicine | Admitting: Emergency Medicine

## 2015-07-04 ENCOUNTER — Encounter (HOSPITAL_COMMUNITY): Payer: Self-pay | Admitting: *Deleted

## 2015-07-04 DIAGNOSIS — I509 Heart failure, unspecified: Secondary | ICD-10-CM | POA: Insufficient documentation

## 2015-07-04 DIAGNOSIS — Z7982 Long term (current) use of aspirin: Secondary | ICD-10-CM | POA: Insufficient documentation

## 2015-07-04 DIAGNOSIS — R0600 Dyspnea, unspecified: Secondary | ICD-10-CM | POA: Insufficient documentation

## 2015-07-04 DIAGNOSIS — R000 Tachycardia, unspecified: Secondary | ICD-10-CM | POA: Insufficient documentation

## 2015-07-04 DIAGNOSIS — Z8669 Personal history of other diseases of the nervous system and sense organs: Secondary | ICD-10-CM | POA: Diagnosis not present

## 2015-07-04 DIAGNOSIS — I10 Essential (primary) hypertension: Secondary | ICD-10-CM | POA: Insufficient documentation

## 2015-07-04 DIAGNOSIS — Z79899 Other long term (current) drug therapy: Secondary | ICD-10-CM | POA: Insufficient documentation

## 2015-07-04 DIAGNOSIS — R0603 Acute respiratory distress: Secondary | ICD-10-CM

## 2015-07-04 DIAGNOSIS — Z941 Heart transplant status: Secondary | ICD-10-CM | POA: Diagnosis not present

## 2015-07-04 DIAGNOSIS — R0602 Shortness of breath: Secondary | ICD-10-CM | POA: Diagnosis present

## 2015-07-04 LAB — I-STAT ARTERIAL BLOOD GAS, ED
ACID-BASE EXCESS: 3 mmol/L — AB (ref 0.0–2.0)
BICARBONATE: 28.1 meq/L — AB (ref 20.0–24.0)
O2 Saturation: 100 %
PCO2 ART: 42.3 mmHg (ref 35.0–45.0)
PH ART: 7.431 (ref 7.350–7.450)
PO2 ART: 235 mmHg — AB (ref 80.0–100.0)
Patient temperature: 98.7
TCO2: 29 mmol/L (ref 0–100)

## 2015-07-04 NOTE — ED Notes (Signed)
Pt. Called EMS for sudden on set SOB. On EMS arrival pt. Was clammy and pale wearing home CPAP and SPO2 85% switched to EMS CPAP went up to 98%. BP 156/110 gave 1 nitro BP dropped to 98/60. Pt. Diagnosed with ALS in march of 2016. 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 EMS heard no lung sounds, pt. Using upper accessory muscle for respirations.

## 2015-07-04 NOTE — ED Provider Notes (Signed)
CSN: [id number]     Arrival date & time 07/04/15  2321 History  This chart was scribed for Tomasita Crumble, MD by Freida Busman, ED Scribe. This patient was seen in room [facility] and the patient's care was started 11:20 PM.     Chief Complaint  Patient presents with  . Shortness of Breath   LEVEL 5 CAVEAT DUE TO Acuity of Condition  The history is provided by the EMS personnel and the patient. No language interpreter was used.   HPI Comments:  [name] is a 58 y.o. male brought in by ambulance, who presents to the Emergency Department for SOB. Per EMS, upon their arrival to the pt he was clammy, wearing his home CPAP and his SPO2 was 85%. Pt was given 1 nitro in route and IV fluids after BP dropped.  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 History limited due to acuity of condition.  Past Medical History  Diagnosis Date  . ALS (amyotrophic lateral sclerosis)   . Heart transplanted   . Hypertension   . CHF (congestive heart failure)    Past Surgical History  Procedure Laterality Date  . Heart transplant    . Orchiectomy      L sided   History reviewed. No pertinent family history. Social History  Substance Use Topics  . Smoking status: Never Smoker   . Smokeless tobacco: Never Used  . Alcohol Use: No    Review of Systems  Unable to perform ROS: Acuity of condition      Allergies  Review of patient's allergies indicates no known allergies.  Home Medications   Prior to Admission medications   Medication Sig Start Date End Date Taking? Authorizing Provider  aspirin 81 MG tablet Take 81 mg by mouth every morning.   Yes Historical Provider, MD  bismuth subsalicylate (PEPTO BISMOL) 262 MG/15ML suspension Take 30 mLs by mouth every 6 (six) hours as needed for diarrhea or loose stools.    Yes Historical Provider, MD  Calcium Carb-Cholecalciferol (CALCIUM 600/VITAMIN D3) 600-800 MG-UNIT TABS Take 1 tablet by  mouth 2 (two) times daily.   Yes Historical Provider, MD  cetirizine (ZYRTEC) 10 MG tablet Take 10 mg by mouth as needed for allergies.    Yes Historical Provider, MD  diphenhydramine-acetaminophen (TYLENOL PM) 25-500 MG TABS Take 1 tablet by mouth at bedtime as needed (for sleep).    Yes Historical Provider, MD  lisinopril (PRINIVIL,ZESTRIL) 5 MG tablet Take 2.5 mg by mouth every morning. 03/23/15  Yes Historical Provider, MD  magnesium oxide (MAG-OX) 400 MG tablet Take 800 mg by mouth 3 (three) times daily. 10/13/14  Yes Historical Provider, MD  mycophenolate (CELLCEPT) 500 MG tablet Take 500 mg by mouth 2 (two) times daily. 01/27/15  Yes Historical Provider, MD  pravastatin (PRAVACHOL) 20 MG tablet Take 20 mg by mouth every evening. 10/09/14  Yes Historical Provider, MD  riluzole (RILUTEK) 50 MG tablet Take 50 mg by mouth 2 (two) times daily. 04/16/15  Yes Historical Provider, MD  tacrolimus (PROGRAF) 1 MG capsule Take 1.5 mg by mouth 2 (two) times daily.   Yes Historical Provider, MD   BP 124/88 mmHg  Pulse 100  Temp(Src) 98.1 F (36.7 C) (Oral)  Resp 29  Ht 5\' 5"  (1.651 m)  Wt 115 lb (52.164 kg)  BMI 19.14 kg/m2  SpO2 100% Physical Exam  Constitutional: Vital signs are normal. He appears well-developed and  well-nourished.  Non-toxic appearance. He does not appear ill.  HENT:  Head: Normocephalic and atraumatic.  Nose: Nose normal.  Mouth/Throat: Oropharynx is clear and moist. No oropharyngeal exudate.  Eyes: Conjunctivae and EOM are normal. Pupils are equal, round, and reactive to light. No scleral icterus.  Neck: Normal range of motion. Neck supple. No tracheal deviation, no edema, no erythema and normal range of motion present. No thyroid mass and no thyromegaly present.  Cardiovascular: Regular rhythm, S1 normal, S2 normal, normal heart sounds, intact distal pulses and normal pulses.  Tachycardia present.  Exam reveals no gallop and no friction rub.   No murmur heard. Pulses:       Radial pulses are 2+ on the right side, and 2+ on the left side.       Dorsalis pedis pulses are 2+ on the right side, and 2+ on the left side.  Pulmonary/Chest: Accessory muscle usage present. Tachypnea noted. He is in respiratory distress. He has no wheezes. He has no rhonchi. He has no rales.  BiPAP over face increase use of accessory muscle and work of breathing  Abdominal: Soft. Normal appearance and bowel sounds are normal. He exhibits no distension, no ascites and no mass. There is no hepatosplenomegaly. There is no tenderness. There is no rebound, no guarding and no CVA tenderness.  Musculoskeletal: Normal range of motion. He exhibits no edema or tenderness.  Lymphadenopathy:    He has no cervical adenopathy.  Neurological: He is alert. He has normal strength. No sensory deficit.  Skin: Skin is warm, dry and intact. No petechiae and no rash noted. He is not diaphoretic. No erythema. No pallor.  Psychiatric: He has a normal mood and affect. His behavior is normal. Judgment normal.  Nursing note and vitals reviewed.   ED Course  Procedures   DIAGNOSTIC STUDIES:  Oxygen Saturation is 100% on BiPAP, normal by my interpretation.    1:43 AM Discussed case with Dr. Dot Lanes  Labs Review Labs Reviewed  CBC WITH DIFFERENTIAL/PLATELET - Abnormal; Notable for the following:    Hemoglobin 11.7 (*)    HCT 36.0 (*)    Neutrophils Relative % 78 (*)    All other components within normal limits  COMPREHENSIVE METABOLIC PANEL - Abnormal; Notable for the following:    Glucose, Bld 113 (*)    Creatinine, Ser 0.55 (*)    Total Protein 6.2 (*)    ALT 9 (*)    All other components within normal limits  URINALYSIS, ROUTINE W REFLEX MICROSCOPIC (NOT AT [facility]) - Abnormal; Notable for the following:    APPearance CLOUDY (*)    All other components within normal limits  I-STAT ARTERIAL BLOOD GAS, ED - Abnormal; Notable for the following:    pO2, Arterial 235.0 (*)    Bicarbonate 28.1 (*)     Acid-Base Excess 3.0 (*)    All other components within normal limits  URINE CULTURE  CULTURE, BLOOD (ROUTINE X 2)  CULTURE, BLOOD (ROUTINE X 2)  BRAIN NATRIURETIC PEPTIDE  LIPASE, BLOOD  PROTIME-INR  BLOOD GAS, ARTERIAL  HEPARIN LEVEL (UNFRACTIONATED)  CBC  I-STAT CG4 LACTIC ACID, ED    Imaging Review Ct Angio Chest Pe W/cm &/or Wo Cm  07/05/2015   CLINICAL DATA:  Status post heart transplant. Extreme shortness of breath, hypoxia. Panic attack. History of hypertension, CHF. Assess for pulmonary embolism.  EXAM: CT ANGIOGRAPHY CHEST WITH CONTRAST  TECHNIQUE: Multidetector CT imaging of the chest was performed using the standard protocol during bolus administration  of intravenous contrast. Multiplanar CT image reconstructions and MIPs were obtained to evaluate the vascular anatomy.  CONTRAST:  80mL OMNIPAQUE IOHEXOL 350 MG/ML SOLN  COMPARISON:  Chest radiograph July 04, 2015 and CT chest May 28, 2015  FINDINGS: PULMONARY ARTERY: Adequate contrast opacification of the pulmonary artery's. Main pulmonary artery is not enlarged. Nonocclusive central pulmonary arterial filling defect at the level of the RIGHT lower lobe pulmonary artery extending into the segmental branch.  MEDIASTINUM: Heart and pericardium are unremarkable, no right heart strain. Thoracic aorta is normal course and caliber, unremarkable. Mediastinal lymphadenopathy, 17 mm short access conglomeration with mild bilateral hilar lymphadenopathy.  LUNGS: Tracheobronchial tree is patent, no pneumothorax. Innumerable tiny centrilobular ground-glass nodules throughout the RIGHT lung, to lesser extent LEFT. RIGHT lower lobe atelectasis/scarring. No pleural effusion.  SOFT TISSUES AND OSSEOUS STRUCTURES: Included view of the abdomen is unremarkable. Visualized soft tissues and included osseous structures appear normal. Status post median sternotomy.  Review of the MIP images confirms the above findings.  IMPRESSION: Acute nonocclusive RIGHT  lower lobe pulmonary embolus. No RIGHT heart strain.  Similar nonspecific tiny ground-glass nodules can be seen with viral pneumonia/bronchiolitis, hypersensitivity pneumonitis. Mediastinal and hilar lymphadenopathy. Recommend follow-up.  Acute findings discussed with and reconfirmed by Dr.Doryce Mcgregory on 07/05/2015 at 2:09 am.   Electronically Signed   By: Awilda Metro M.D.   On: 07/05/2015 02:11   Dg Chest Port 1 View  07/05/2015   CLINICAL DATA:  Shortness of breath.  Hypoxia.  EXAM: PORTABLE CHEST - 1 VIEW  COMPARISON:  05/27/2015  FINDINGS: Postoperative changes in the mediastinum. Normal heart size and pulmonary vascularity. Mediastinal contours appear intact. Suggestion of slight linear atelectasis in the lung bases. No focal consolidation or airspace disease in the lungs. No pneumothorax.  IMPRESSION: Suggestion of mild linear atelectasis in the lung bases. No focal consolidation.   Electronically Signed   By: Burman Nieves M.D.   On: 07/05/2015 00:36   I have personally reviewed and evaluated these images and lab results as part of my medical decision-making.   EKG Interpretation   Date/Time:  Saturday July 04 2015 23:48:54 EDT Ventricular Rate:  105 PR Interval:  151 QRS Duration: 73 QT Interval:  337 QTC Calculation: 445 R Axis:   96 Text Interpretation:  Sinus tachycardia Artifact Confirmed by Erroll Luna [phone]) on 07/05/2015 12:15:53 AM      MDM   Final diagnoses:  None   Patient presents to the ED for SOB of sudden onset.  This could be worsening of his ALS or a complication of his heart transplant.  PE is also possible, will obtain CT for evaluation.  ABG reveals normal pH and CO2. He was immediately placed on bipap and respirations improved.  His initial O2 sat was 85% on room air.  He has been >96% while on bipap.  He denies any pain anywhere.  I spoke with Dr. Dot Lanes who accepts the patient to the MICU at [facility]. CT scan reveals a pulmonary embolism.  Heparin drip was started.   CRITICAL CARE Performed by: Tomasita Crumble, MD Total critical care time: - respiratory distress Critical care time was exclusive of separately billable procedures and treating other patients. Critical care was necessary to treat or prevent imminent or life-threatening deterioration. Critical care was time spent personally by me on the following activities: development of treatment plan with patient and/or surrogate as well as nursing, discussions with consultants, evaluation of patient's response to treatment, examination of patient,  obtaining history from patient or surrogate, ordering and performing treatments and interventions, ordering and review of laboratory studies, ordering and review of radiographic studies, pulse oximetry and re-evaluation of patient's condition.  I personally performed the services described in this documentation, which was scribed in my presence. The recorded information has been reviewed and is accurate.   Tomasita Crumble, MD 07/05/15 [phone]

## 2015-07-05 ENCOUNTER — Emergency Department (HOSPITAL_COMMUNITY): Payer: Medicare Other

## 2015-07-05 DIAGNOSIS — R0600 Dyspnea, unspecified: Secondary | ICD-10-CM | POA: Diagnosis not present

## 2015-07-05 LAB — COMPREHENSIVE METABOLIC PANEL
ALBUMIN: 3.6 g/dL (ref 3.5–5.0)
ALT: 9 U/L — ABNORMAL LOW (ref 17–63)
ANION GAP: 8 (ref 5–15)
AST: 18 U/L (ref 15–41)
Alkaline Phosphatase: 58 U/L (ref 38–126)
BUN: 17 mg/dL (ref 6–20)
CHLORIDE: 102 mmol/L (ref 101–111)
CO2: 28 mmol/L (ref 22–32)
Calcium: 9.8 mg/dL (ref 8.9–10.3)
Creatinine, Ser: 0.55 mg/dL — ABNORMAL LOW (ref 0.61–1.24)
GFR calc Af Amer: >60 mL/min (ref >60)
Glucose, Bld: 113 mg/dL — ABNORMAL HIGH (ref 65–99)
POTASSIUM: 3.8 mmol/L (ref 3.5–5.1)
Sodium: 138 mmol/L (ref 135–145)
TOTAL PROTEIN: 6.2 g/dL — AB (ref 6.5–8.1)
Total Bilirubin: 0.6 mg/dL (ref 0.3–1.2)

## 2015-07-05 LAB — URINALYSIS, ROUTINE W REFLEX MICROSCOPIC
BILIRUBIN URINE: NEGATIVE
GLUCOSE, UA: NEGATIVE mg/dL
HGB URINE DIPSTICK: NEGATIVE
KETONES UR: NEGATIVE mg/dL
Leukocytes, UA: NEGATIVE
Nitrite: NEGATIVE
PROTEIN: NEGATIVE mg/dL
Specific Gravity, Urine: 1.018 (ref 1.005–1.030)
UROBILINOGEN UA: 0.2 mg/dL (ref 0.0–1.0)
pH: 7.5 (ref 5.0–8.0)

## 2015-07-05 LAB — CBC WITH DIFFERENTIAL/PLATELET
BASOS PCT: 0 % (ref 0–1)
Basophils Absolute: 0 10*3/uL (ref 0.0–0.1)
EOS ABS: 0.1 10*3/uL (ref 0.0–0.7)
EOS PCT: 2 % (ref 0–5)
HCT: 36 % — ABNORMAL LOW (ref 39.0–52.0)
HEMOGLOBIN: 11.7 g/dL — AB (ref 13.0–17.0)
Lymphocytes Relative: 12 % (ref 12–46)
Lymphs Abs: 0.7 10*3/uL (ref 0.7–4.0)
MCH: 26.9 pg (ref 26.0–34.0)
MCHC: 32.5 g/dL (ref 30.0–36.0)
MCV: 82.8 fL (ref 78.0–100.0)
Monocytes Absolute: 0.5 10*3/uL (ref 0.1–1.0)
Monocytes Relative: 8 % (ref 3–12)
NEUTROS PCT: 78 % — AB (ref 43–77)
Neutro Abs: 4.8 10*3/uL (ref 1.7–7.7)
PLATELETS: 201 10*3/uL (ref 150–400)
RBC: 4.35 MIL/uL (ref 4.22–5.81)
RDW: 14.5 % (ref 11.5–15.5)
WBC: 6.1 10*3/uL (ref 4.0–10.5)

## 2015-07-05 LAB — I-STAT CG4 LACTIC ACID, ED: LACTIC ACID, VENOUS: 1.19 mmol/L (ref 0.5–2.0)

## 2015-07-05 LAB — BRAIN NATRIURETIC PEPTIDE: B NATRIURETIC PEPTIDE 5: 13.4 pg/mL (ref 0.0–100.0)

## 2015-07-05 LAB — PROTIME-INR
INR: 1.07 (ref 0.00–1.49)
Prothrombin Time: 14.1 seconds (ref 11.6–15.2)

## 2015-07-05 LAB — LIPASE, BLOOD: LIPASE: 30 U/L (ref 22–51)

## 2015-07-05 MED ORDER — IOHEXOL 350 MG/ML SOLN
80.0000 mL | Freq: Once | INTRAVENOUS | Status: AC | PRN
  Administered 2015-07-05: 80 mL via INTRAVENOUS

## 2015-07-05 MED ORDER — HEPARIN (PORCINE) IN NACL 100-0.45 UNIT/ML-% IJ SOLN
800.0000 [IU]/h | INTRAMUSCULAR | Status: DC
  Administered 2015-07-05: 800 [IU]/h via INTRAVENOUS
  Filled 2015-07-05: qty 250

## 2015-07-05 MED ORDER — HEPARIN BOLUS VIA INFUSION
3000.0000 [IU] | Freq: Once | INTRAVENOUS | Status: AC
  Administered 2015-07-05: 3000 [IU] via INTRAVENOUS
  Filled 2015-07-05: qty 3000

## 2015-07-05 NOTE — Progress Notes (Signed)
ANTICOAGULATION CONSULT NOTE - Initial Consult  Pharmacy Consult for heparin Indication: pulmonary embolus  No Known Allergies  Patient Measurements: Height:  (165.1 cm) Weight: 115 lb (52.164 kg) IBW/kg (Calculated) : 61.5  Vital Signs: Temp: 98.1 F (36.7 C) (08/27 2345) Temp Source: Oral (08/27 2345) BP: 124/88 mmHg (08/28 0145) Pulse Rate: 95 (08/28 0145)  Labs:  Recent Labs  07/04/15 2355  HGB 11.7*  HCT 36.0*  PLT 201  LABPROT 14.1  INR 1.07  CREATININE 0.55*    Estimated Creatinine Clearance: 73.4 mL/min (by C-G formula based on Cr of 0.55).   Medical History: Past Medical History  Diagnosis Date  . ALS (amyotrophic lateral sclerosis)   . Heart transplanted   . Hypertension   . CHF (congestive heart failure)      Assessment: 59yo male c/o sudden onset of SOB, was clammy and pale at home per EMS, CT shows nonocclusive PE without heart strain, to begin heparin.  Goal of Therapy:  Heparin level 0.3-0.7 units/ml Monitor platelets by anticoagulation protocol: Yes   Plan:  Will give heparin 3000 units IV bolus x1 followed by gtt at 800 units/hr and monitor heparin levels and CBC.  Vernard Gambles, PharmD, BCPS  07/05/2015,2:10 AM

## 2015-07-07 LAB — URINE CULTURE

## 2015-07-08 DIAGNOSIS — I2699 Other pulmonary embolism without acute cor pulmonale: Secondary | ICD-10-CM | POA: Diagnosis present

## 2015-07-10 LAB — CULTURE, BLOOD (ROUTINE X 2): Culture: NO GROWTH

## 2015-07-27 ENCOUNTER — Inpatient Hospital Stay
Admission: AD | Admit: 2015-07-27 | Discharge: 2015-09-03 | Disposition: A | Discharge status: Skilled Nursing Facility | Payer: Medicare Other | Source: Ambulatory Visit | Attending: Internal Medicine | Admitting: Internal Medicine

## 2015-07-27 ENCOUNTER — Other Ambulatory Visit (HOSPITAL_COMMUNITY): Payer: Self-pay

## 2015-07-27 DIAGNOSIS — G1221 Amyotrophic lateral sclerosis: Secondary | ICD-10-CM | POA: Diagnosis present

## 2015-07-27 DIAGNOSIS — D649 Anemia, unspecified: Secondary | ICD-10-CM | POA: Diagnosis present

## 2015-07-27 DIAGNOSIS — T148XXA Other injury of unspecified body region, initial encounter: Secondary | ICD-10-CM

## 2015-07-27 DIAGNOSIS — J189 Pneumonia, unspecified organism: Secondary | ICD-10-CM | POA: Insufficient documentation

## 2015-07-27 DIAGNOSIS — R52 Pain, unspecified: Secondary | ICD-10-CM

## 2015-07-27 DIAGNOSIS — I10 Essential (primary) hypertension: Secondary | ICD-10-CM | POA: Diagnosis present

## 2015-07-27 DIAGNOSIS — Z941 Heart transplant status: Secondary | ICD-10-CM

## 2015-07-27 DIAGNOSIS — I2699 Other pulmonary embolism without acute cor pulmonale: Secondary | ICD-10-CM | POA: Diagnosis present

## 2015-07-27 DIAGNOSIS — J9610 Chronic respiratory failure, unspecified whether with hypoxia or hypercapnia: Secondary | ICD-10-CM | POA: Diagnosis present

## 2015-07-27 DIAGNOSIS — Z95828 Presence of other vascular implants and grafts: Secondary | ICD-10-CM

## 2015-07-27 DIAGNOSIS — J9690 Respiratory failure, unspecified, unspecified whether with hypoxia or hypercapnia: Secondary | ICD-10-CM

## 2015-07-27 DIAGNOSIS — Z931 Gastrostomy status: Secondary | ICD-10-CM

## 2015-07-27 MED ORDER — IOHEXOL 300 MG/ML  SOLN
50.0000 mL | Freq: Once | INTRAMUSCULAR | Status: DC | PRN
  Administered 2015-07-27: 50 mL via ORAL

## 2015-07-28 ENCOUNTER — Other Ambulatory Visit (HOSPITAL_COMMUNITY): Payer: Self-pay

## 2015-07-28 LAB — COMPREHENSIVE METABOLIC PANEL
ALBUMIN: 2.7 g/dL — AB (ref 3.5–5.0)
ALK PHOS: 53 U/L (ref 38–126)
ALT: 10 U/L — ABNORMAL LOW (ref 17–63)
AST: 13 U/L — AB (ref 15–41)
Anion gap: 9 (ref 5–15)
BILIRUBIN TOTAL: 0.3 mg/dL (ref 0.3–1.2)
BUN: 8 mg/dL (ref 6–20)
CALCIUM: 9.5 mg/dL (ref 8.9–10.3)
CO2: 28 mmol/L (ref 22–32)
Chloride: 103 mmol/L (ref 101–111)
Creatinine, Ser: 0.46 mg/dL — ABNORMAL LOW (ref 0.61–1.24)
GFR calc Af Amer: >60 mL/min (ref >60)
GFR calc non Af Amer: >60 mL/min (ref >60)
GLUCOSE: 104 mg/dL — AB (ref 65–99)
POTASSIUM: 3.6 mmol/L (ref 3.5–5.1)
SODIUM: 140 mmol/L (ref 135–145)
TOTAL PROTEIN: 5.9 g/dL — AB (ref 6.5–8.1)

## 2015-07-28 LAB — CBC
HEMATOCRIT: 26.7 % — AB (ref 39.0–52.0)
HEMOGLOBIN: 8.5 g/dL — AB (ref 13.0–17.0)
MCH: 26.4 pg (ref 26.0–34.0)
MCHC: 31.8 g/dL (ref 30.0–36.0)
MCV: 82.9 fL (ref 78.0–100.0)
Platelets: 517 10*3/uL — ABNORMAL HIGH (ref 150–400)
RBC: 3.22 MIL/uL — ABNORMAL LOW (ref 4.22–5.81)
RDW: 14 % (ref 11.5–15.5)
WBC: 6.7 10*3/uL (ref 4.0–10.5)

## 2015-07-28 LAB — PROTIME-INR
INR: 1.29 (ref 0.00–1.49)
PROTHROMBIN TIME: 16.2 s — AB (ref 11.6–15.2)

## 2015-07-28 LAB — PHOSPHORUS: Phosphorus: 2.8 mg/dL (ref 2.5–4.6)

## 2015-07-28 LAB — TSH: TSH: 1.813 u[IU]/mL (ref 0.350–4.500)

## 2015-07-28 LAB — C DIFFICILE QUICK SCREEN W PCR REFLEX
C DIFFICILE (CDIFF) INTERP: NEGATIVE
C DIFFICLE (CDIFF) ANTIGEN: NEGATIVE
C Diff toxin: NEGATIVE

## 2015-07-28 LAB — MAGNESIUM: Magnesium: 1.7 mg/dL (ref 1.7–2.4)

## 2015-07-29 LAB — PROTIME-INR
INR: 1.23 (ref 0.00–1.49)
PROTHROMBIN TIME: 15.7 s — AB (ref 11.6–15.2)

## 2015-07-30 DIAGNOSIS — Z941 Heart transplant status: Secondary | ICD-10-CM | POA: Diagnosis not present

## 2015-07-30 DIAGNOSIS — J9610 Chronic respiratory failure, unspecified whether with hypoxia or hypercapnia: Secondary | ICD-10-CM

## 2015-07-30 DIAGNOSIS — G1221 Amyotrophic lateral sclerosis: Secondary | ICD-10-CM | POA: Diagnosis not present

## 2015-07-30 DIAGNOSIS — J189 Pneumonia, unspecified organism: Secondary | ICD-10-CM | POA: Diagnosis not present

## 2015-07-30 LAB — PROTIME-INR
INR: 1.1 (ref 0.00–1.49)
Prothrombin Time: 14.4 seconds (ref 11.6–15.2)

## 2015-07-30 NOTE — Consult Note (Signed)
PCCM CONSULT NOTE  ADMISSION DATE: 07/27/2015 CONSULT DATE: 07/30/2015 REFERRING PROVIDER: Dr. Sharyon Medicus  CC: Respiratory failure  HPI: 59 yo male was at [facility] for PE.  He was transferred to [facility] for management of ALS.  He also has hx of giant cell myocarditis s/p heart transplant in 2011.  He was scheduled for PEG.  He developed respiratory failure after procedure.  He was intubated, but failed to wean from vent.  He subsequently had tracheostomy.  He was treated for PNA with GPC in culture.  He was transferred to [facility] for vent weaning and rehab.  He has PMHx of ALS, Giant Cell Myocarditis, HTN, Dysphagia, Pulmonary embolism  He has PSHx of Cardiac transplant, tracheostomy, PEG placement  NKDA  SHx: Denies smoking or alcohol abuse  FHx: non contributory  Meds: Tacrolimus, Riluzole, Pepcid, Myfortic, Remeron, Reglan, Midodrine, Melatonin, Labetalol, Duoneb, Gabapentin, ASA, Lovenox   SUBJECTIVE: Denies chest pain, abdominal pain.  OBJECTIVE: BP 128/82, HR 98, SaO2 100% General: sitting up in bed HEENT: Pupils reactive, trach site clean Cardiac: regular, no murmur Chest: scattered rhonchi Abd: soft, PEG site clean, non tender Ext: no edema, varus deformity lower legs Neuro: Alert, follows commands, can lift forearms off bed Skin: no rashes   CMP Latest Ref Rng 07/28/2015 07/04/2015 05/27/2015  Glucose 65 - 99 mg/dL 161(W) 960(A) 540(J)  BUN 6 - 20 mg/dL 8 17 81(X)  Creatinine 0.61 - 1.24 mg/dL 9.14(N) 8.29(F) 6.21  Sodium 135 - 145 mmol/L 140 138 138  Potassium 3.5 - 5.1 mmol/L 3.6 3.8 4.1  Chloride 101 - 111 mmol/L 103 102 98(L)  CO2 22 - 32 mmol/L Calcium 8.9 - 10.3 mg/dL 9.5 9.8 10.4(H)  Total Protein 6.5 - 8.1 g/dL 5.9(L) 6.2(L) -  Total Bilirubin 0.3 - 1.2 mg/dL 0.3 0.6 -  Alkaline Phos 38 - 126 U/L 53 58 -  AST 15 - 41 U/L 13(L) 18 -  ALT 17 - 63 U/L 10(L) 9(L) -     CBC Latest Ref Rng 07/28/2015 07/04/2015 05/27/2015  WBC 4.0 - 10.5 K/uL 6.7 6.1 6.6   Hemoglobin 13.0 - 17.0 g/dL 3.0(Q) 11.7(L) 13.0  Hematocrit 39.0 - 52.0 % 26.7(L) 36.0(L) 41.4  Platelets 150 - 400 K/uL 517(H) 201 208     Dg Chest Port 1 View  07/28/2015   CLINICAL DATA:  Respiratory failure.  EXAM: PORTABLE CHEST - 1 VIEW  COMPARISON:  07/04/2015  FINDINGS: Sternotomy wires unchanged. Tracheostomy tube in adequate position. Lungs are adequately inflated with opacification over the right base likely small effusion with associated atelectasis. Two small adjacent air collections over the right lung opacification with possible air-fluid level as cannot exclude a cavitating process. Cardiomediastinal silhouette and remainder of the exam is unchanged.  IMPRESSION: Right base opacification likely a small effusion with associated airspace density which may be due to atelectasis, infarct or infection. Two small adjacent air collections within this region of opacification as cannot exclude a cavitary process. Consider PA and lateral chest x-ray for better evaluation of the right base versus CT.   Electronically Signed   By: Elberta Fortis M.D.   On: 07/28/2015 08:25   Dg Abd Portable 1v  07/27/2015   CLINICAL DATA:  Verification of PEG tube placement  EXAM: PORTABLE ABDOMEN - 1 VIEW  COMPARISON:  Portable exam 1923 hr compared to 07/02/2009  FINDINGS: Contrast has been injected through the PEG tube at the gastric antrum.  Contrast opacifies the gastric fundus and the descending  duodenum.  No contrast extravasation identified.  Nondistended air-filled bowel loops.  LEFT lung clear.  Opacity at the RIGHT lung base may represent layered effusion in combination with atelectasis or infiltrate.  IMPRESSION: Contrast injected through the PEG tube opacifies the gastric lumen and duodenum without extravasation.   Electronically Signed   By: Ulyses Southward M.D.   On: 07/27/2015 22:46     CULTURES: C diff 9/20 > negative  STUDIES:  EVENTS: 9/19 Transfer to [facility]  DISCUSSION: 59 yo male with acute  on chronic respiratory failure in setting of ALS complicated by recent PE and HCAP.  Also has hx of giant cell myocarditis s/p heart transplant.  ASSESSMENT/PLAN:  Acute on chronic respiratory failure with failure to wean from vent s/p tracheostomy. Plan: - pressure support wean as tolerated  HCAP. Plan: - abx per primary team - f/u CXR intermittently  Hx of PE. Plan: - lovenox per primary team  Amyotrophic lateral sclerosis. Plan: - explained to pt and sister that his respiratory failure could be related to his acute illness, but more likely related to natural progression of his ALS   Updated pt's sister at bedside.  Coralyn Helling, MD [facility] Pulmonary/Critical Care 07/30/2015, 10:15 AM Pager:  [phone] After 3pm call: [phone]

## 2015-07-31 LAB — PROTIME-INR
INR: 1.19 (ref 0.00–1.49)
PROTHROMBIN TIME: 15.2 s (ref 11.6–15.2)

## 2015-08-01 LAB — PROTIME-INR
INR: 1.16 (ref 0.00–1.49)
PROTHROMBIN TIME: 15 s (ref 11.6–15.2)

## 2015-08-02 LAB — PROTIME-INR
INR: 1.19 (ref 0.00–1.49)
Prothrombin Time: 15.3 seconds — ABNORMAL HIGH (ref 11.6–15.2)

## 2015-08-03 ENCOUNTER — Other Ambulatory Visit (HOSPITAL_COMMUNITY): Payer: Self-pay

## 2015-08-03 DIAGNOSIS — G1221 Amyotrophic lateral sclerosis: Secondary | ICD-10-CM | POA: Diagnosis not present

## 2015-08-03 DIAGNOSIS — I2699 Other pulmonary embolism without acute cor pulmonale: Secondary | ICD-10-CM

## 2015-08-03 DIAGNOSIS — J189 Pneumonia, unspecified organism: Secondary | ICD-10-CM | POA: Diagnosis not present

## 2015-08-03 DIAGNOSIS — J9610 Chronic respiratory failure, unspecified whether with hypoxia or hypercapnia: Secondary | ICD-10-CM | POA: Diagnosis not present

## 2015-08-03 LAB — PROTIME-INR
INR: 1.21 (ref 0.00–1.49)
PROTHROMBIN TIME: 15.5 s — AB (ref 11.6–15.2)

## 2015-08-03 LAB — CBC
HCT: 33.1 % — ABNORMAL LOW (ref 39.0–52.0)
HEMOGLOBIN: 10.2 g/dL — AB (ref 13.0–17.0)
MCH: 25.6 pg — AB (ref 26.0–34.0)
MCHC: 30.8 g/dL (ref 30.0–36.0)
MCV: 83 fL (ref 78.0–100.0)
PLATELETS: 519 10*3/uL — AB (ref 150–400)
RBC: 3.99 MIL/uL — ABNORMAL LOW (ref 4.22–5.81)
RDW: 15 % (ref 11.5–15.5)
WBC: 5.6 10*3/uL (ref 4.0–10.5)

## 2015-08-03 LAB — BASIC METABOLIC PANEL
Anion gap: 7 (ref 5–15)
BUN: 10 mg/dL (ref 6–20)
CALCIUM: 9.7 mg/dL (ref 8.9–10.3)
CHLORIDE: 103 mmol/L (ref 101–111)
CO2: 27 mmol/L (ref 22–32)
CREATININE: 0.4 mg/dL — AB (ref 0.61–1.24)
Glucose, Bld: 125 mg/dL — ABNORMAL HIGH (ref 65–99)
Potassium: 3.9 mmol/L (ref 3.5–5.1)
SODIUM: 137 mmol/L (ref 135–145)

## 2015-08-03 NOTE — Progress Notes (Signed)
PCCM CONSULT NOTE  ADMISSION DATE: 07/27/2015 CONSULT DATE: 07/30/2015 REFERRING PROVIDER: Dr. Sharyon Medicus  CC: Respiratory failure  HPI: 59 yo male was at [facility] for PE.  He was transferred to [facility] for management of ALS.  He also has hx of giant cell myocarditis s/p heart transplant in 2011.  He was scheduled for PEG.  He developed respiratory failure after procedure.  He was intubated, but failed to wean from vent.  He subsequently had tracheostomy.  He was treated for PNA with GPC in culture.  He was transferred to [facility] for vent weaning and rehab.  SUBJECTIVE: Denies chest pain, abdominal pain.  OBJECTIVE: Afebrile sats 98% on PS of 12 cmH20  General: sitting up in bed HEENT: Pupils reactive, trach site clean Cardiac: regular, no murmur Chest: scattered rhonchi, some accessory muscle use  Abd: soft, PEG site clean, non tender Ext: no edema, varus deformity lower legs Neuro: Alert, follows commands, can lift forearms off bed Skin: no rashes   CMP Latest Ref Rng 08/03/2015 07/28/2015 07/04/2015  Glucose 65 - 99 mg/dL 621(H) 086(V) 784(O)  BUN 6 - 20 mg/dL Creatinine 0.61 - 1.24 mg/dL 9.62(X) 5.28(U) 1.32(G)  Sodium 135 - 145 mmol/L 137 140 138  Potassium 3.5 - 5.1 mmol/L 3.9 3.6 3.8  Chloride 101 - 111 mmol/L 103 103 102  CO2 22 - 32 mmol/L Calcium 8.9 - 10.3 mg/dL 9.7 9.5 9.8  Total Protein 6.5 - 8.1 g/dL - 5.9(L) 6.2(L)  Total Bilirubin 0.3 - 1.2 mg/dL - 0.3 0.6  Alkaline Phos 38 - 126 U/L - 53 58  AST 15 - 41 U/L - 13(L) 18  ALT 17 - 63 U/L - 10(L) 9(L)     CBC Latest Ref Rng 08/03/2015 07/28/2015 07/04/2015  WBC 4.0 - 10.5 K/uL 5.6 6.7 6.1  Hemoglobin 13.0 - 17.0 g/dL 10.2(L) 8.5(L) 11.7(L)  Hematocrit 39.0 - 52.0 % 33.1(L) 26.7(L) 36.0(L)  Platelets 150 - 400 K/uL 519(H) 517(H) 201     Dg Chest Port 1 View  08/03/2015   CLINICAL DATA:  Tracheostomy, respiratory failure, right lower lobe pulmonary embolus  EXAM: PORTABLE CHEST 1 VIEW  COMPARISON:   07/28/2015  FINDINGS: Stable tracheostomy position in the upper to mid trachea 7.4 cm above the carina. Prior median sternotomy noted. Exam is rotated to the left. Normal heart size and vascularity. Right lower lobe collapse/consolidation persist obscuring the hemidiaphragm medially. No enlarging effusion or pneumothorax. Gastrostomy present in the left upper quadrant.  IMPRESSION: Persistent right basilar collapse/consolidation. Pneumonia not excluded. No significant interval change.   Electronically Signed   By: Judie Petit.  Shick M.D.   On: 08/03/2015 07:57     CULTURES: C diff 9/20 > negative  STUDIES:  EVENTS: 9/19 Transfer to [facility]  DISCUSSION: 59 yo male with acute on chronic respiratory failure in setting of ALS complicated by recent PE and HCAP.  Also has hx of giant cell myocarditis s/p heart transplant.  ASSESSMENT/PLAN:  Acute on chronic respiratory failure with failure to wean from vent s/p tracheostomy. Plan: - pressure support wean as tolerated w/ mandatory full support at night   HCAP. Plan: - abx per primary team - f/u CXR intermittently  Hx of PE. Plan: - lovenox per primary team  Amyotrophic lateral sclerosis. Plan: - explained to pt and sister that his respiratory failure could be related to his acute illness, but more likely related to natural progression of his ALS   Attending Note:  59  year old male with PMH of ALS who developed respiratory failure after a PE and an HCAP.  He also has history of giant cell myocarditis s/p heart transplant.  On exam, coarse BS diffusely.  I reviewed CXR myself, trach in good position, bibasilar atelectasis.  Discussed with PCCM-NP and SSH-MD.  Acute respiratory failure due to PNA.  S/P trach.  - PS wean as able.  - Mandatory vent at night.  Tracheostomy status:  - Maintain trach cuff up.  - No PMV.  HCAP:  - F/U on cultures.  - Abx for total of 14 days.  PE:  - Continue lovenox.  - Will need to discuss oral  agents.  ALS:  - Rehab as able.  Patient seen and examined, agree with above note.  I dictated the care and orders written for this patient under my direction.  Alyson Reedy, MD [phone]

## 2015-08-04 LAB — PROTIME-INR
INR: 1.18 (ref 0.00–1.49)
Prothrombin Time: 15.2 seconds (ref 11.6–15.2)

## 2015-08-05 DIAGNOSIS — G1221 Amyotrophic lateral sclerosis: Secondary | ICD-10-CM | POA: Diagnosis not present

## 2015-08-05 DIAGNOSIS — J189 Pneumonia, unspecified organism: Secondary | ICD-10-CM | POA: Diagnosis not present

## 2015-08-05 DIAGNOSIS — J9610 Chronic respiratory failure, unspecified whether with hypoxia or hypercapnia: Secondary | ICD-10-CM | POA: Diagnosis not present

## 2015-08-05 DIAGNOSIS — Z930 Tracheostomy status: Secondary | ICD-10-CM

## 2015-08-05 LAB — PROTIME-INR
INR: 1.1 (ref 0.00–1.49)
PROTHROMBIN TIME: 14.4 s (ref 11.6–15.2)

## 2015-08-05 NOTE — Progress Notes (Signed)
PCCM CONSULT NOTE  ADMISSION DATE: 07/27/2015 CONSULT DATE: 07/30/2015 REFERRING PROVIDER: Dr. Sharyon Medicus  CC: Respiratory failure  HPI: 59 yo male was at [facility] for PE.  He was transferred to [facility] for management of ALS.  He also has hx of giant cell myocarditis s/p heart transplant in 2011.  He was scheduled for PEG.  He developed respiratory failure after procedure.  He was intubated, but failed to wean from vent.  He subsequently had tracheostomy.  He was treated for PNA with GPC in culture.  He was transferred to [facility] for vent weaning and rehab.  SUBJECTIVE: Denies chest pain, abdominal pain.  OBJECTIVE: Temp 98.4 hr 97 rr 16 bp 104/76 sats 100%  General: no distress.  HEENT: Pupils reactive, trach site clean Cardiac: regular, no murmur Chest: clear   Abd: soft, PEG site clean, non tender Ext: no edema, varus deformity lower legs Neuro: Alert, follows commands, can lift forearms off bed, oriented  Skin: no rashes   CMP Latest Ref Rng 08/03/2015 07/28/2015 07/04/2015  Glucose 65 - 99 mg/dL 161(W) 960(A) 540(J)  BUN 6 - 20 mg/dL Creatinine 0.61 - 1.24 mg/dL 8.11(B) 1.47(W) 2.95(A)  Sodium 135 - 145 mmol/L 137 140 138  Potassium 3.5 - 5.1 mmol/L 3.9 3.6 3.8  Chloride 101 - 111 mmol/L 103 103 102  CO2 22 - 32 mmol/L Calcium 8.9 - 10.3 mg/dL 9.7 9.5 9.8  Total Protein 6.5 - 8.1 g/dL - 5.9(L) 6.2(L)  Total Bilirubin 0.3 - 1.2 mg/dL - 0.3 0.6  Alkaline Phos 38 - 126 U/L - 53 58  AST 15 - 41 U/L - 13(L) 18  ALT 17 - 63 U/L - 10(L) 9(L)     CBC Latest Ref Rng 08/03/2015 07/28/2015 07/04/2015  WBC 4.0 - 10.5 K/uL 5.6 6.7 6.1  Hemoglobin 13.0 - 17.0 g/dL 10.2(L) 8.5(L) 11.7(L)  Hematocrit 39.0 - 52.0 % 33.1(L) 26.7(L) 36.0(L)  Platelets 150 - 400 K/uL 519(H) 517(H) 201     Dg Chest Port 1 View  08/03/2015   CLINICAL DATA:  Tracheostomy, respiratory failure, right lower lobe pulmonary embolus  EXAM: PORTABLE CHEST 1 VIEW  COMPARISON:  07/28/2015  FINDINGS: Stable  tracheostomy position in the upper to mid trachea 7.4 cm above the carina. Prior median sternotomy noted. Exam is rotated to the left. Normal heart size and vascularity. Right lower lobe collapse/consolidation persist obscuring the hemidiaphragm medially. No enlarging effusion or pneumothorax. Gastrostomy present in the left upper quadrant.  IMPRESSION: Persistent right basilar collapse/consolidation. Pneumonia not excluded. No significant interval change.   Electronically Signed   By: Judie Petit.  Shick M.D.   On: 08/03/2015 07:57     CULTURES: C diff 9/20 > negative  STUDIES:  EVENTS: 9/19 Transfer to [facility] 9/28 HCAP treatment has completed. (took off problem list). Attempting ATC.    DISCUSSION: 59 yo male with acute on chronic respiratory failure in setting of ALS complicated by recent PE and HCAP.  Also has hx of giant cell myocarditis s/p heart transplant.  ASSESSMENT/PLAN:  Acute on chronic respiratory failure with failure to wean from vent s/p tracheostomy. >ready to try ATC today. I am concerned about his stamina. He may really need some degree of continued ventilation at this point; such as nocturnal ventilation.  Plan: - pressure support wean as tolerated w/ mandatory full support at night  - try ATC.. Will d/w pt after a few days to eval tolerance. He understands we may yet recommend nocturnal  ventilation.   Hx of PE. Plan: - lovenox per primary team  Amyotrophic lateral sclerosis. Plan: - explained to pt and sister that his respiratory failure could be related to his acute illness, but more likely related to natural progression of his ALS  Simonne Martinet ACNP-BC [facility] Pulmonary/Critical Care Pager # [phone] OR # [phone] if no answer   Attending Note:    Simonne Martinet, NP  Attending Note:  59 year old male with PMH of ALS who developed respiratory failure after a PE and an HCAP. He also has history of giant cell myocarditis s/p heart transplant. On exam,  coarse BS diffusely.  I reviewed CXR myself, trach in good position, bibasilar atelectasis.  Discussed with PCCM-NP and SSH-MD.  Acute respiratory failure due to PNA.S/P trach. - PS wean as able. - Mandatory vent at night.  Tracheostomy status: - Maintain trach cuff up. - No PMV.  HCAP: - F/U on cultures. - Abx for total of 14 days.  PE: - Continue lovenox. - Will need to discuss oral agents.  ALS: - Rehab as able.  Will speak with patient tomorrow to discuss whether or not will proceed with nocturnal ventilation.  Patient seen and examined, agree with above note. I dictated the care and orders written for this patient under my direction.  Alyson Reedy, MD [phone]

## 2015-08-06 DIAGNOSIS — J9610 Chronic respiratory failure, unspecified whether with hypoxia or hypercapnia: Secondary | ICD-10-CM | POA: Diagnosis not present

## 2015-08-06 DIAGNOSIS — G1221 Amyotrophic lateral sclerosis: Secondary | ICD-10-CM | POA: Diagnosis not present

## 2015-08-06 DIAGNOSIS — Z930 Tracheostomy status: Secondary | ICD-10-CM | POA: Diagnosis not present

## 2015-08-06 DIAGNOSIS — J189 Pneumonia, unspecified organism: Secondary | ICD-10-CM | POA: Diagnosis not present

## 2015-08-06 LAB — PROTIME-INR
INR: 1.11 (ref 0.00–1.49)
PROTHROMBIN TIME: 14.5 s (ref 11.6–15.2)

## 2015-08-06 NOTE — Progress Notes (Signed)
PCCM CONSULT NOTE  ADMISSION DATE: 07/27/2015 CONSULT DATE: 07/30/2015 REFERRING PROVIDER: Dr. Sharyon Medicus  CC: Respiratory failure  HPI: 59 yo male was at [facility] for PE.  He was transferred to [facility] for management of ALS.  He also has hx of giant cell myocarditis s/p heart transplant in 2011.  He was scheduled for PEG.  He developed respiratory failure after procedure.  He was intubated, but failed to wean from vent.  He subsequently had tracheostomy.  He was treated for PNA with GPC in culture.  He was transferred to [facility] for vent weaning and rehab.  SUBJECTIVE: Denies chest pain, abdominal pain.  OBJECTIVE: No distress resting on vent   General: no distress.  HEENT: Pupils reactive, trach site clean Cardiac: regular, no murmur Chest: clear   Abd: soft, PEG site clean, non tender Ext: no edema, varus deformity lower legs Neuro: Alert, follows commands, can lift forearms off bed, oriented  Skin: no rashes   CMP Latest Ref Rng 08/03/2015 07/28/2015 07/04/2015  Glucose 65 - 99 mg/dL 161(W) 960(A) 540(J)  BUN 6 - 20 mg/dL Creatinine 0.61 - 1.24 mg/dL 8.11(B) 1.47(W) 2.95(A)  Sodium 135 - 145 mmol/L 137 140 138  Potassium 3.5 - 5.1 mmol/L 3.9 3.6 3.8  Chloride 101 - 111 mmol/L 103 103 102  CO2 22 - 32 mmol/L Calcium 8.9 - 10.3 mg/dL 9.7 9.5 9.8  Total Protein 6.5 - 8.1 g/dL - 5.9(L) 6.2(L)  Total Bilirubin 0.3 - 1.2 mg/dL - 0.3 0.6  Alkaline Phos 38 - 126 U/L - 53 58  AST 15 - 41 U/L - 13(L) 18  ALT 17 - 63 U/L - 10(L) 9(L)     CBC Latest Ref Rng 08/03/2015 07/28/2015 07/04/2015  WBC 4.0 - 10.5 K/uL 5.6 6.7 6.1  Hemoglobin 13.0 - 17.0 g/dL 10.2(L) 8.5(L) 11.7(L)  Hematocrit 39.0 - 52.0 % 33.1(L) 26.7(L) 36.0(L)  Platelets 150 - 400 K/uL 519(H) 517(H) 201     Dg Chest Port 1 View  08/03/2015   CLINICAL DATA:  Tracheostomy, respiratory failure, right lower lobe pulmonary embolus  EXAM: PORTABLE CHEST 1 VIEW  COMPARISON:  07/28/2015  FINDINGS: Stable tracheostomy  position in the upper to mid trachea 7.4 cm above the carina. Prior median sternotomy noted. Exam is rotated to the left. Normal heart size and vascularity. Right lower lobe collapse/consolidation persist obscuring the hemidiaphragm medially. No enlarging effusion or pneumothorax. Gastrostomy present in the left upper quadrant.  IMPRESSION: Persistent right basilar collapse/consolidation. Pneumonia not excluded. No significant interval change.   Electronically Signed   By: Judie Petit.  Shick M.D.   On: 08/03/2015 07:57     CULTURES: C diff 9/20 > negative  STUDIES:  EVENTS: 9/19 Transfer to [facility] 9/28 HCAP treatment has completed. (took off problem list). Attempting ATC. 9/29 only lasted 5 minutes on ATC   DISCUSSION: 59 yo male with acute on chronic respiratory failure in setting of ALS complicated by recent PE and HCAP.  Also has hx of giant cell myocarditis s/p heart transplant.  ASSESSMENT/PLAN:  Acute on chronic respiratory failure with failure to wean from vent s/p tracheostomy. Will likely need vent most of the day.  Plan: - pressure support wean as tolerated w/ mandatory full support at night  -would go ahead and set up for home ventilation.   Hx of PE. Plan: - lovenox per primary team  Amyotrophic lateral sclerosis. Plan: - explained to pt and sister that his respiratory failure could be  related to his acute illness, but more likely related to natural progression of his ALS  Simonne Martinet ACNP-BC [facility] Pulmonary/Critical Care Pager # [phone] OR # [phone] if no answer  Attending Note:  59 year old male with PMH of ALS who developed respiratory failure after a PE and an HCAP. He also has history of giant cell myocarditis s/p heart transplant. On exam, coarse BS diffusely.   I reviewed CXR myself, trach in good position, bibasilar atelectasis.   Discussed with PCCM-NP and SSH-MD.   Acute respiratory failure due to PNA. S/P trach.   - PS wean as able.   - Mandatory vent  at night.   Tracheostomy status:   - Maintain trach cuff up.   - No PMV.   HCAP:   - F/U on cultures.   - Abx for total of 14 days.   PE:   - Continue lovenox.   - Will need to discuss oral agents.   ALS:   - Rehab as able.   Please proceed with home vent set up, do not foresee patient not needing it in the future.  Patient seen and examined, agree with above note. I dictated the care and orders written for this patient under my direction.   Alyson Reedy, MD  [phone]

## 2015-08-07 ENCOUNTER — Other Ambulatory Visit (HOSPITAL_COMMUNITY): Payer: Self-pay

## 2015-08-07 LAB — CBC
HCT: 28.1 % — ABNORMAL LOW (ref 39.0–52.0)
Hemoglobin: 9 g/dL — ABNORMAL LOW (ref 13.0–17.0)
MCH: 26.6 pg (ref 26.0–34.0)
MCHC: 32 g/dL (ref 30.0–36.0)
MCV: 83.1 fL (ref 78.0–100.0)
PLATELETS: 326 10*3/uL (ref 150–400)
RBC: 3.38 MIL/uL — AB (ref 4.22–5.81)
RDW: 15.3 % (ref 11.5–15.5)
WBC: 6.2 10*3/uL (ref 4.0–10.5)

## 2015-08-07 LAB — PROTIME-INR
INR: 1.09 (ref 0.00–1.49)
Prothrombin Time: 14.3 seconds (ref 11.6–15.2)

## 2015-08-07 LAB — BASIC METABOLIC PANEL
Anion gap: 8 (ref 5–15)
BUN: 19 mg/dL (ref 6–20)
CHLORIDE: 103 mmol/L (ref 101–111)
CO2: 27 mmol/L (ref 22–32)
Calcium: 9.4 mg/dL (ref 8.9–10.3)
Creatinine, Ser: 0.41 mg/dL — ABNORMAL LOW (ref 0.61–1.24)
GFR calc Af Amer: >60 mL/min (ref >60)
GLUCOSE: 116 mg/dL — AB (ref 65–99)
POTASSIUM: 4.1 mmol/L (ref 3.5–5.1)
Sodium: 138 mmol/L (ref 135–145)

## 2015-08-08 LAB — PROTIME-INR
INR: 1.19 (ref 0.00–1.49)
PROTHROMBIN TIME: 15.3 s — AB (ref 11.6–15.2)

## 2015-08-09 LAB — PROTIME-INR
INR: 1.26 (ref 0.00–1.49)
Prothrombin Time: 16 seconds — ABNORMAL HIGH (ref 11.6–15.2)

## 2015-08-10 LAB — PROTIME-INR
INR: 1.29 (ref 0.00–1.49)
PROTHROMBIN TIME: 16.2 s — AB (ref 11.6–15.2)

## 2015-08-11 DIAGNOSIS — G1221 Amyotrophic lateral sclerosis: Secondary | ICD-10-CM | POA: Diagnosis not present

## 2015-08-11 DIAGNOSIS — Z930 Tracheostomy status: Secondary | ICD-10-CM | POA: Diagnosis not present

## 2015-08-11 DIAGNOSIS — J9610 Chronic respiratory failure, unspecified whether with hypoxia or hypercapnia: Secondary | ICD-10-CM | POA: Diagnosis not present

## 2015-08-11 DIAGNOSIS — I2699 Other pulmonary embolism without acute cor pulmonale: Secondary | ICD-10-CM | POA: Diagnosis not present

## 2015-08-11 LAB — PROTIME-INR
INR: 1.57 — AB (ref 0.00–1.49)
PROTHROMBIN TIME: 18.8 s — AB (ref 11.6–15.2)

## 2015-08-11 NOTE — Progress Notes (Signed)
PCCM CONSULT NOTE  ADMISSION DATE: 07/27/2015 CONSULT DATE: 07/30/2015 REFERRING PROVIDER: Dr. Sharyon Medicus  CC: Respiratory failure  HPI: 59 yo male was at [facility] for PE.  He was transferred to [facility] for management of ALS.  He also has hx of giant cell myocarditis s/p heart transplant in 2011.  He was scheduled for PEG.  He developed respiratory failure after procedure.  He was intubated, but failed to wean from vent.  He subsequently had tracheostomy.  He was treated for PNA with GPC in culture.  He was transferred to [facility] for vent weaning and rehab.  SUBJECTIVE: Denies chest pain, abdominal pain.  OBJECTIVE: No distress resting on vent   Sat 98% on 28% FiO2 on vent.  PEEP 8.  General: no distress.  HEENT: Pupils reactive, trach site clean Cardiac: regular, no murmur Chest: clear   Abd: soft, PEG site clean, non tender Ext: no edema, varus deformity lower legs Neuro: Alert, follows commands, can lift forearms off bed, oriented  Skin: no rashes   CMP Latest Ref Rng 08/07/2015 08/03/2015 07/28/2015  Glucose 65 - 99 mg/dL 161(W) 960(A) 540(J)  BUN 6 - 20 mg/dL Creatinine 0.61 - 1.24 mg/dL 8.11(B) 1.47(W) 2.95(A)  Sodium 135 - 145 mmol/L 138 137 140  Potassium 3.5 - 5.1 mmol/L 4.1 3.9 3.6  Chloride 101 - 111 mmol/L 103 103 103  CO2 22 - 32 mmol/L Calcium 8.9 - 10.3 mg/dL 9.4 9.7 9.5  Total Protein 6.5 - 8.1 g/dL - - 5.9(L)  Total Bilirubin 0.3 - 1.2 mg/dL - - 0.3  Alkaline Phos 38 - 126 U/L - - 53  AST 15 - 41 U/L - - 13(L)  ALT 17 - 63 U/L - - 10(L)     CBC Latest Ref Rng 08/07/2015 08/03/2015 07/28/2015  WBC 4.0 - 10.5 K/uL 6.2 5.6 6.7  Hemoglobin 13.0 - 17.0 g/dL 9.0(L) 10.2(L) 8.5(L)  Hematocrit 39.0 - 52.0 % 28.1(L) 33.1(L) 26.7(L)  Platelets 150 - 400 K/uL 326 519(H) 517(H)     No results found.   CULTURES: C diff 9/20 > negative  STUDIES:  EVENTS: 9/19 Transfer to [facility] 9/28 HCAP treatment has completed. (took off problem list). Attempting ATC. 9/29  only lasted 5 minutes on ATC   DISCUSSION: 59 yo male with acute on chronic respiratory failure in setting of ALS complicated by recent PE and HCAP.  Also has hx of giant cell myocarditis s/p heart transplant.  ASSESSMENT/PLAN:  Acute on chronic respiratory failure with failure to wean from vent s/p tracheostomy. Will likely need vent most of the day.  Plan: - Pressure support wean as tolerated w/ mandatory full support at night. - Would go ahead and set up for home ventilation.  - Decrease PEEP to 5.  Hx of PE. Plan: - Lovenox per primary team  Amyotrophic lateral sclerosis. Plan: - Explained to pt and sister that his respiratory failure could be related to his acute illness, but more likely related to natural progression of his ALS  59 year old male with PMH of ALS who developed respiratory failure after a PE and an HCAP. He also has history of giant cell myocarditis s/p heart transplant. On exam, coarse BS diffusely.   I reviewed CXR myself, trach in good position, bibasilar atelectasis.   Discussed with PCCM-NP and SSH-MD.   Acute respiratory failure due to PNA. S/P trach.   - PS wean as able.   - Mandatory vent at night.   -  Decrease PEEP to 5.  Tracheostomy status:   - Maintain trach cuff up.   - No PMV.   HCAP:   - F/U on cultures.   - Abx for total of 14 days.   PE:   - Continue lovenox.   - Will need to discuss oral agents.   ALS:   - Rehab as able.   Please proceed with home vent set up, do not foresee patient not needing it in the future.  Alyson Reedy, M.D. [facility] Pulmonary/Critical Care Medicine. Pager: [phone]. After hours pager: [phone].

## 2015-08-12 LAB — PROTIME-INR
INR: 1.61 — AB (ref 0.00–1.49)
Prothrombin Time: 19.2 seconds — ABNORMAL HIGH (ref 11.6–15.2)

## 2015-08-13 LAB — PROTIME-INR
INR: 1.85 — ABNORMAL HIGH (ref 0.00–1.49)
PROTHROMBIN TIME: 21.3 s — AB (ref 11.6–15.2)

## 2015-08-14 DIAGNOSIS — J189 Pneumonia, unspecified organism: Secondary | ICD-10-CM | POA: Diagnosis not present

## 2015-08-14 DIAGNOSIS — J9610 Chronic respiratory failure, unspecified whether with hypoxia or hypercapnia: Secondary | ICD-10-CM | POA: Diagnosis not present

## 2015-08-14 DIAGNOSIS — I2699 Other pulmonary embolism without acute cor pulmonale: Secondary | ICD-10-CM | POA: Diagnosis not present

## 2015-08-14 DIAGNOSIS — G1221 Amyotrophic lateral sclerosis: Secondary | ICD-10-CM | POA: Diagnosis not present

## 2015-08-14 LAB — PROTIME-INR
INR: 1.97 — AB (ref 0.00–1.49)
Prothrombin Time: 22.3 seconds — ABNORMAL HIGH (ref 11.6–15.2)

## 2015-08-14 NOTE — Progress Notes (Signed)
PCCM CONSULT NOTE  ADMISSION DATE: 07/27/2015 CONSULT DATE: 07/30/2015 REFERRING PROVIDER: Dr. Sharyon Medicus  CC: Respiratory failure  HPI: 59 yo male was at [facility] for PE.  He was transferred to [facility] for management of ALS.  He also has hx of giant cell myocarditis s/p heart transplant in 2011.  He was scheduled for PEG.  He developed respiratory failure after procedure.  He was intubated, but failed to wean from vent.  He subsequently had tracheostomy.  He was treated for PNA with GPC in culture.  He was transferred to [facility] for vent weaning and rehab.  SUBJECTIVE: Denies chest pain, abdominal pain.  OBJECTIVE: No distress resting on vent   Sat 98% on 28% FiO2 on vent.  PEEP 8.  General: no distress.  HEENT: Pupils reactive, trach site clean Cardiac: regular, no murmur Chest: clear   Abd: soft, PEG site clean, non tender Ext: no edema, varus deformity lower legs Neuro: Alert, follows commands, can lift forearms off bed, oriented  Skin: no rashes   CMP Latest Ref Rng 08/07/2015 08/03/2015 07/28/2015  Glucose 65 - 99 mg/dL 161(W) 960(A) 540(J)  BUN 6 - 20 mg/dL Creatinine 0.61 - 1.24 mg/dL 8.11(B) 1.47(W) 2.95(A)  Sodium 135 - 145 mmol/L 138 137 140  Potassium 3.5 - 5.1 mmol/L 4.1 3.9 3.6  Chloride 101 - 111 mmol/L 103 103 103  CO2 22 - 32 mmol/L Calcium 8.9 - 10.3 mg/dL 9.4 9.7 9.5  Total Protein 6.5 - 8.1 g/dL - - 5.9(L)  Total Bilirubin 0.3 - 1.2 mg/dL - - 0.3  Alkaline Phos 38 - 126 U/L - - 53  AST 15 - 41 U/L - - 13(L)  ALT 17 - 63 U/L - - 10(L)     CBC Latest Ref Rng 08/07/2015 08/03/2015 07/28/2015  WBC 4.0 - 10.5 K/uL 6.2 5.6 6.7  Hemoglobin 13.0 - 17.0 g/dL 9.0(L) 10.2(L) 8.5(L)  Hematocrit 39.0 - 52.0 % 28.1(L) 33.1(L) 26.7(L)  Platelets 150 - 400 K/uL 326 519(H) 517(H)     No results found.   CULTURES: C diff 9/20 > negative  STUDIES:  EVENTS: 9/19 Transfer to [facility] 9/28 HCAP treatment has completed. (took off problem list). Attempting ATC. 9/29  only lasted 5 minutes on ATC   DISCUSSION: 59 yo male with acute on chronic respiratory failure in setting of ALS complicated by recent PE and HCAP.  Also has hx of giant cell myocarditis s/p heart transplant.  ASSESSMENT/PLAN:  Acute on chronic respiratory failure with failure to wean from vent s/p tracheostomy. Will likely need vent most of the day.  Plan: - Pressure support wean during the day with mandatory vent overnight. - Recommend vent SNF. - Would go ahead and set up for home ventilation.  - Decrease PEEP to 5.  Hx of PE. Plan: - Lovenox per primary team  Amyotrophic lateral sclerosis. Plan: - Explained to pt and sister that his respiratory failure could be related to his acute illness, but more likely related to natural progression of his ALS  59 year old male with PMH of ALS who developed respiratory failure after a PE and an HCAP. He also has history of giant cell myocarditis s/p heart transplant. On exam, coarse BS diffusely.   I reviewed CXR myself, trach in good position, bibasilar atelectasis.   Discussed with PCCM-NP and SSH-MD.   Acute respiratory failure due to PNA. S/P trach.   - PS wean as able.   - Mandatory vent  at night.   - Decrease PEEP to 5.  Tracheostomy status:   - Maintain trach cuff up.   - No PMV.   HCAP:   - F/U on cultures.   - Abx for total of 14 days.   PE:   - Continue lovenox.   - Will need to discuss oral agents.   ALS:   - Rehab as able.   Please proceed with home vent set up, do not foresee patient not needing it in the future.  Alyson Reedy, M.D. [facility] Pulmonary/Critical Care Medicine. Pager: [phone]. After hours pager: [phone].

## 2015-08-15 ENCOUNTER — Other Ambulatory Visit (HOSPITAL_COMMUNITY): Payer: Self-pay

## 2015-08-15 LAB — BASIC METABOLIC PANEL
ANION GAP: 9 (ref 5–15)
BUN: 16 mg/dL (ref 6–20)
CALCIUM: 9.6 mg/dL (ref 8.9–10.3)
CO2: 31 mmol/L (ref 22–32)
Chloride: 97 mmol/L — ABNORMAL LOW (ref 101–111)
Creatinine, Ser: 0.39 mg/dL — ABNORMAL LOW (ref 0.61–1.24)
GFR calc Af Amer: >60 mL/min (ref >60)
Glucose, Bld: 110 mg/dL — ABNORMAL HIGH (ref 65–99)
POTASSIUM: 4.4 mmol/L (ref 3.5–5.1)
Sodium: 137 mmol/L (ref 135–145)

## 2015-08-15 LAB — CBC
HCT: 30.6 % — ABNORMAL LOW (ref 39.0–52.0)
Hemoglobin: 9.7 g/dL — ABNORMAL LOW (ref 13.0–17.0)
MCH: 26.8 pg (ref 26.0–34.0)
MCHC: 31.7 g/dL (ref 30.0–36.0)
MCV: 84.5 fL (ref 78.0–100.0)
PLATELETS: 234 10*3/uL (ref 150–400)
RBC: 3.62 MIL/uL — AB (ref 4.22–5.81)
RDW: 15.5 % (ref 11.5–15.5)
WBC: 8.7 10*3/uL (ref 4.0–10.5)

## 2015-08-15 LAB — PROTIME-INR
INR: 2.44 — AB (ref 0.00–1.49)
PROTHROMBIN TIME: 26.2 s — AB (ref 11.6–15.2)

## 2015-08-16 LAB — PROTIME-INR
INR: 2.39 — AB (ref 0.00–1.49)
PROTHROMBIN TIME: 25.8 s — AB (ref 11.6–15.2)

## 2015-08-17 DIAGNOSIS — G1221 Amyotrophic lateral sclerosis: Secondary | ICD-10-CM | POA: Diagnosis not present

## 2015-08-17 DIAGNOSIS — J9610 Chronic respiratory failure, unspecified whether with hypoxia or hypercapnia: Secondary | ICD-10-CM | POA: Diagnosis not present

## 2015-08-17 DIAGNOSIS — I2699 Other pulmonary embolism without acute cor pulmonale: Secondary | ICD-10-CM | POA: Diagnosis not present

## 2015-08-17 LAB — PROTIME-INR
INR: 2.26 — AB (ref 0.00–1.49)
PROTHROMBIN TIME: 24.8 s — AB (ref 11.6–15.2)

## 2015-08-17 LAB — VANCOMYCIN, TROUGH

## 2015-08-17 NOTE — Progress Notes (Signed)
PCCM CONSULT NOTE  ADMISSION DATE: 07/27/2015 CONSULT DATE: 07/30/2015 REFERRING PROVIDER: Dr. Sharyon Medicus  CC: Respiratory failure  HPI: 59 yo male was at [facility] for PE.  He was transferred to [facility] for management of ALS.  He also has hx of giant cell myocarditis s/p heart transplant in 2011.  He was scheduled for PEG.  He developed respiratory failure after procedure.  He was intubated, but failed to wean from vent.  He subsequently had tracheostomy.  He was treated for PNA with GPC in culture.  He was transferred to [facility] for vent weaning and rehab.  SUBJECTIVE: No major changes, PSV yesterday 10/8 for 12 hours, some ATC this morning  OBJECTIVE:  T 96.5 HR 112 RR 16  BP92/66 O2 sat 100%  on 28% FiO2 on vent.  PEEP 8. ACVC RR 10, T Vol 500cc  General: comfortable on vent HEENT: trache clean, dry, intact Cardiac: RRR, no mgr Chest: vent supported breaths, clear Abd: soft, BS+, PEG in place Ext: no edema, muscular atrophy noted Neuro: Alert, follows commands, answering questions Skin: no obvious breakdown   CMP Latest Ref Rng 08/15/2015 08/07/2015 08/03/2015  Glucose 65 - 99 mg/dL 161(W) 960(A) 540(J)  BUN 6 - 20 mg/dL Creatinine 0.61 - 1.24 mg/dL 8.11(B) 1.47(W) 2.95(A)  Sodium 135 - 145 mmol/L 137 138 137  Potassium 3.5 - 5.1 mmol/L 4.4 4.1 3.9  Chloride 101 - 111 mmol/L 97(L) 103 103  CO2 22 - 32 mmol/L Calcium 8.9 - 10.3 mg/dL 9.6 9.4 9.7  Total Protein 6.5 - 8.1 g/dL - - -  Total Bilirubin 0.3 - 1.2 mg/dL - - -  Alkaline Phos 38 - 126 U/L - - -  AST 15 - 41 U/L - - -  ALT 17 - 63 U/L - - -     CBC Latest Ref Rng 08/15/2015 08/07/2015 08/03/2015  WBC 4.0 - 10.5 K/uL 8.7 6.2 5.6  Hemoglobin 13.0 - 17.0 g/dL 2.1(H) 0.8(M) 10.2(L)  Hematocrit 39.0 - 52.0 % 30.6(L) 28.1(L) 33.1(L)  Platelets 150 - 400 K/uL 234 326 519(H)     No results found.   CULTURES: C diff 9/20 > negative  STUDIES:  EVENTS: 9/19 Transfer to [facility] 9/28 HCAP treatment has completed.  (took off problem list). Attempting ATC. 9/29 only lasted 5 minutes on ATC 10/10 > 12 hours PSV yesterday   DISCUSSION: 59 yo male with acute on chronic respiratory failure in setting of ALS complicated by recent PE and HCAP.  Also has hx of giant cell myocarditis s/p heart transplant.  Given his terminal illness and poor prognosis, I see no role for vent weaning as he reports feeling no different on pressure support.  Would encourage using the speaking valve as much as possible  ASSESSMENT/PLAN:  Acute on chronic respiratory failure with failure to wean from vent s/p tracheostomy. He will need vent permanently Plan: - Would plan to just use ACVC ventilation at all times - would push more use of speaking valve - Recommend vent SNF and consider plans for home vent - Decrease PEEP to 5.  Hx of PE. Plan: - Lovenox per primary team  Amyotrophic lateral sclerosis Plan: - I explained to the patient and his sister again today that I don't see a role for vent weaning as his respiratory failure is chronic and will not get better as it is due to his ALS.  Heber Glenham, MD [place] P[facility]M Pager: [phone] Cell: [phone] After 3pm or  if no response, call [phone]

## 2015-08-18 LAB — PROTIME-INR
INR: 2.31 — ABNORMAL HIGH (ref 0.00–1.49)
PROTHROMBIN TIME: 25.1 s — AB (ref 11.6–15.2)

## 2015-08-19 ENCOUNTER — Other Ambulatory Visit (HOSPITAL_COMMUNITY): Payer: Self-pay

## 2015-08-19 LAB — PROTIME-INR
INR: 2.83 — AB (ref 0.00–1.49)
Prothrombin Time: 29.3 seconds — ABNORMAL HIGH (ref 11.6–15.2)

## 2015-08-20 ENCOUNTER — Other Ambulatory Visit (HOSPITAL_COMMUNITY): Payer: Medicare Other

## 2015-08-20 LAB — BLOOD GAS, ARTERIAL
Acid-Base Excess: 1.6 mmol/L (ref 0.0–2.0)
BICARBONATE: 25.7 meq/L — AB (ref 20.0–24.0)
FIO2: 1
O2 Saturation: 95.5 %
PATIENT TEMPERATURE: 100
PCO2 ART: 42.2 mmHg (ref 35.0–45.0)
PEEP: 5 cmH2O
RATE: 16 resp/min
TCO2: 26.9 mmol/L (ref 0–100)
VT: 500 mL
pH, Arterial: 7.406 (ref 7.350–7.450)
pO2, Arterial: 88.8 mmHg (ref 80.0–100.0)

## 2015-08-20 LAB — PROTIME-INR
INR: 2.42 — ABNORMAL HIGH (ref 0.00–1.49)
PROTHROMBIN TIME: 26 s — AB (ref 11.6–15.2)

## 2015-08-21 LAB — PROTIME-INR
INR: 2.42 — ABNORMAL HIGH (ref 0.00–1.49)
PROTHROMBIN TIME: 26 s — AB (ref 11.6–15.2)

## 2015-08-22 LAB — PROTIME-INR
INR: 2.34 — ABNORMAL HIGH (ref 0.00–1.49)
PROTHROMBIN TIME: 25.4 s — AB (ref 11.6–15.2)

## 2015-08-22 LAB — VANCOMYCIN, TROUGH: VANCOMYCIN TR: 16 ug/mL (ref 10.0–20.0)

## 2015-08-23 LAB — PROTIME-INR
INR: 1.77 — AB (ref 0.00–1.49)
Prothrombin Time: 20.6 seconds — ABNORMAL HIGH (ref 11.6–15.2)

## 2015-08-23 LAB — CBC WITH DIFFERENTIAL/PLATELET
BASOS ABS: 0 10*3/uL (ref 0.0–0.1)
Basophils Relative: 0 %
Eosinophils Absolute: 0.2 10*3/uL (ref 0.0–0.7)
Eosinophils Relative: 3 %
HEMATOCRIT: 23.6 % — AB (ref 39.0–52.0)
Hemoglobin: 7.3 g/dL — ABNORMAL LOW (ref 13.0–17.0)
LYMPHS ABS: 0.7 10*3/uL (ref 0.7–4.0)
LYMPHS PCT: 10 %
MCH: 25.1 pg — AB (ref 26.0–34.0)
MCHC: 30.9 g/dL (ref 30.0–36.0)
MCV: 81.1 fL (ref 78.0–100.0)
MONOS PCT: 10 %
Monocytes Absolute: 0.7 10*3/uL (ref 0.1–1.0)
NEUTROS ABS: 5.1 10*3/uL (ref 1.7–7.7)
Neutrophils Relative %: 77 %
Platelets: 551 10*3/uL — ABNORMAL HIGH (ref 150–400)
RBC: 2.91 MIL/uL — ABNORMAL LOW (ref 4.22–5.81)
RDW: 15 % (ref 11.5–15.5)
WBC: 6.7 10*3/uL (ref 4.0–10.5)

## 2015-08-23 LAB — CULTURE, RESPIRATORY W GRAM STAIN

## 2015-08-23 LAB — BASIC METABOLIC PANEL
ANION GAP: 8 (ref 5–15)
BUN: 14 mg/dL (ref 6–20)
CHLORIDE: 100 mmol/L — AB (ref 101–111)
CO2: 28 mmol/L (ref 22–32)
Calcium: 9.1 mg/dL (ref 8.9–10.3)
Creatinine, Ser: <0.3 mg/dL — ABNORMAL LOW (ref 0.61–1.24)
GLUCOSE: 97 mg/dL (ref 65–99)
POTASSIUM: 4.3 mmol/L (ref 3.5–5.1)
Sodium: 136 mmol/L (ref 135–145)

## 2015-08-24 DIAGNOSIS — G1221 Amyotrophic lateral sclerosis: Secondary | ICD-10-CM | POA: Diagnosis not present

## 2015-08-24 DIAGNOSIS — J189 Pneumonia, unspecified organism: Secondary | ICD-10-CM | POA: Diagnosis not present

## 2015-08-24 DIAGNOSIS — J9610 Chronic respiratory failure, unspecified whether with hypoxia or hypercapnia: Secondary | ICD-10-CM | POA: Diagnosis not present

## 2015-08-24 LAB — PREPARE RBC (CROSSMATCH)

## 2015-08-24 LAB — VANCOMYCIN, TROUGH: Vancomycin Tr: 18 ug/mL (ref 10.0–20.0)

## 2015-08-24 LAB — CBC
HEMATOCRIT: 20.3 % — AB (ref 39.0–52.0)
HEMOGLOBIN: 6.5 g/dL — AB (ref 13.0–17.0)
MCH: 26 pg (ref 26.0–34.0)
MCHC: 32 g/dL (ref 30.0–36.0)
MCV: 81.2 fL (ref 78.0–100.0)
Platelets: 526 10*3/uL — ABNORMAL HIGH (ref 150–400)
RBC: 2.5 MIL/uL — ABNORMAL LOW (ref 4.22–5.81)
RDW: 15.2 % (ref 11.5–15.5)
WBC: 6.1 10*3/uL (ref 4.0–10.5)

## 2015-08-24 LAB — VITAMIN B12: VITAMIN B 12: 365 pg/mL (ref 180–914)

## 2015-08-24 LAB — FERRITIN: FERRITIN: 228 ng/mL (ref 24–336)

## 2015-08-24 LAB — PROTIME-INR
INR: 2.03 — ABNORMAL HIGH (ref 0.00–1.49)
PROTHROMBIN TIME: 22.8 s — AB (ref 11.6–15.2)

## 2015-08-24 LAB — TSH: TSH: 4.383 u[IU]/mL (ref 0.350–4.500)

## 2015-08-24 LAB — IRON AND TIBC
IRON: 21 ug/dL — AB (ref 45–182)
Saturation Ratios: 7 % — ABNORMAL LOW (ref 17.9–39.5)
TIBC: 321 ug/dL (ref 250–450)
UIBC: 300 ug/dL

## 2015-08-24 LAB — ABO/RH: ABO/RH(D): O POS

## 2015-08-24 NOTE — Progress Notes (Signed)
PCCM CONSULT NOTE  ADMISSION DATE: 07/27/2015 CONSULT DATE: 07/30/2015 REFERRING PROVIDER: Dr. Sharyon Medicus  CC: Respiratory failure  HPI: 59 yo male was at [facility] for PE.  He was transferred to [facility] for management of ALS.  He also has hx of giant cell myocarditis s/p heart transplant in 2011.  He was scheduled for PEG.  He developed respiratory failure after procedure.  He was intubated, but failed to wean from vent.  He subsequently had tracheostomy.  He was treated for PNA with GPC in culture.  He was transferred to [facility] for vent weaning and rehab.  SUBJECTIVE: Still bringing up lots of blood from trach   OBJECTIVE: 97.3, 102 18 110/64 sats 100%  General: comfortable on vent HEENT: trache clean, dry, intact, sig amt of bloody drainage in sxn container  Cardiac: RRR, no mgr Chest: vent supported breaths, clear Abd: soft, BS+, PEG in place Ext: no edema, muscular atrophy noted Neuro: Alert, follows commands, answering questions Skin: no obvious breakdown   CMP Latest Ref Rng 08/23/2015 08/15/2015 08/07/2015  Glucose 65 - 99 mg/dL 97 914(N) 829(F)  BUN 6 - 20 mg/dL Creatinine 0.61 - 1.24 mg/dL <6.21(H) 0.86(V) 7.84(O)  Sodium 135 - 145 mmol/L 136 137 138  Potassium 3.5 - 5.1 mmol/L 4.3 4.4 4.1  Chloride 101 - 111 mmol/L 100(L) 97(L) 103  CO2 22 - 32 mmol/L Calcium 8.9 - 10.3 mg/dL 9.1 9.6 9.4  Total Protein 6.5 - 8.1 g/dL - - -  Total Bilirubin 0.3 - 1.2 mg/dL - - -  Alkaline Phos 38 - 126 U/L - - -  AST 15 - 41 U/L - - -  ALT 17 - 63 U/L - - -     CBC Latest Ref Rng 08/24/2015 08/23/2015 08/15/2015  WBC 4.0 - 10.5 K/uL 6.1 6.7 8.7  Hemoglobin 13.0 - 17.0 g/dL 6.5(LL) 7.3(L) 9.7(L)  Hematocrit 39.0 - 52.0 % 20.3(L) 23.6(L) 30.6(L)  Platelets 150 - 400 K/uL 526(H) 551(H) 234     Dg Chest Port 1 View  08/20/2015  CLINICAL DATA:  59 year old male status post PICC line placement. Initial encounter. EXAM: PORTABLE CHEST 1 VIEW COMPARISON:  08/19/2015 and earlier.  FINDINGS: Portable AP semi upright view at 2006 hours. Right side PICC line placed. Tip projects less than 10 mm below the cavoatrial junction level. Stable tracheostomy tube. Stable cardiac size and mediastinal contours. No pneumothorax. Continued veiling opacity at the right lung base. IMPRESSION: 1. Right PICC line placed, tip just below the cavoatrial junction level. 2. Right pleural effusion. Electronically Signed   By: Odessa Fleming M.D.   On: 08/20/2015 20:13     CULTURES: C diff 9/20 > negative  STUDIES:  EVENTS: 9/19 Transfer to [facility] 9/28 HCAP treatment has completed. (took off problem list). Attempting ATC. 9/29 only lasted 5 minutes on ATC 10/10 > 12 hours PSV yesterday 10/17: FOB for bleeding from trach  DISCUSSION: 59 yo male with acute on chronic respiratory failure in setting of ALS complicated by recent PE and HCAP.  Also has hx of giant cell myocarditis s/p heart transplant. Given his terminal illness and poor prognosis, I see no role for vent weaning as he reports feeling no different on pressure support.  Would encourage using the speaking valve as much as possible  ASSESSMENT/PLAN:  Acute on chronic respiratory failure with failure to wean from vent s/p tracheostomy. He will need vent permanently Plan: - Would plan to just use ACVC ventilation  at all times - would push more use of speaking valve after bleeding stop - Recommend vent SNF and consider plans for home vent - Decrease PEEP to 5.  Hx of PE. Plan: - Lovenox per primary team  Amyotrophic lateral sclerosis Plan: - I explained to the patient and his sister again today that I don't see a role for vent weaning as his respiratory failure is chronic and will not get better as it is due to his ALS.   STAFF NOTE: I, Rory Percyaniel Onyinyechi Huante, MD FACP have personally reviewed patient's available data, including medical history, events of note, physical examination and test results as part of my evaluation. I have discussed  with resident/NP and other care providers such as pharmacist, RN and RRT. In addition, I personally evaluated patient and elicited key findings of: unable to wean, bronch done noting bleeding upper nose , phayrnx, FFP stat, has been on coum for dvt, so avoid vit k resistance for now, ENT consult recommended, packing needed, cbc to follow, no sbt, pcxr follow up , i updated sister in full and pt at bedside, cover asp ABX for now, unabel to do inline PMV, hold further fo rnow  Mcarthur Rossettianiel J. Tyson AliasFeinstein, MD, FACP Pgr: [phone] [place] [facility] 08/24/2015 12:54 PM

## 2015-08-24 NOTE — Procedures (Signed)
Bronchoscopy Procedure Note Franne GripDomonic Raso [id number] Feb 15, 1956  Procedure: Bronchoscopy Indications: Diagnostic evaluation of the airways - bloody suctioning  Procedure Details Consent: Risks of procedure as well as the alternatives and risks of each were explained to the (patient/caregiver).  Consent for procedure obtained. Time Out: Verified patient identification, verified procedure, site/side was marked, verified correct patient position, special equipment/implants available, medications/allergies/relevent history reviewed, required imaging and test results available.  Performed  In preparation for procedure, patient was given 100% FiO2 and bronchoscope lubricated. Sedation: Etomidate  Airway entered and the following bronchi were examined: RUL, RML, RLL, LUL, LLL and Bronchi.   Procedures performed: Brushings performed- no Bronchoscope removed.  , Patient placed back on 100% FiO2 at conclusion of procedure.    Evaluation Hemodynamic Status: BP stable throughout; O2 sats: stable throughout Patient's Current Condition: stable Specimens:  None Complications: No apparent complications Patient did tolerate procedure well.   Nelda BucksFEINSTEIN,Viaan Knippenberg J. 08/24/2015   1. Bleeding active coming above trach into rt lower lobe 2. Mild suction trauma superior seg rll, likely not source 3. Placed scope through rt nares, noted active bleeding hypopharynx posterior to nose, aspirating into airway, blood all over epiglottis  rec ffp stat, ent, packing D/w   Dr Arsenio LoaderH  Christeena Krogh J. Tyson AliasFeinstein, MD, FACP Pgr: [phone] [place] [facility] Critical [facility]

## 2015-08-25 LAB — TYPE AND SCREEN
ABO/RH(D): O POS
ANTIBODY SCREEN: NEGATIVE
UNIT DIVISION: 0
Unit division: 0

## 2015-08-25 LAB — PREPARE FRESH FROZEN PLASMA
UNIT DIVISION: 0
Unit division: 0

## 2015-08-25 LAB — CBC
HEMATOCRIT: 30.2 % — AB (ref 39.0–52.0)
Hemoglobin: 10 g/dL — ABNORMAL LOW (ref 13.0–17.0)
MCH: 27.4 pg (ref 26.0–34.0)
MCHC: 33.1 g/dL (ref 30.0–36.0)
MCV: 82.7 fL (ref 78.0–100.0)
PLATELETS: 504 10*3/uL — AB (ref 150–400)
RBC: 3.65 MIL/uL — ABNORMAL LOW (ref 4.22–5.81)
RDW: 14.7 % (ref 11.5–15.5)
WBC: 8.6 10*3/uL (ref 4.0–10.5)

## 2015-08-25 LAB — FOLATE RBC
FOLATE, RBC: 2313 ng/mL (ref >498)
Folate, Hemolysate: 457.9 ng/mL
Hematocrit: 19.8 % — ABNORMAL LOW (ref 37.5–51.0)

## 2015-08-25 LAB — PROTIME-INR
INR: 1.54 — AB (ref 0.00–1.49)
PROTHROMBIN TIME: 18.5 s — AB (ref 11.6–15.2)

## 2015-08-26 DIAGNOSIS — J9610 Chronic respiratory failure, unspecified whether with hypoxia or hypercapnia: Secondary | ICD-10-CM | POA: Diagnosis not present

## 2015-08-26 DIAGNOSIS — G1221 Amyotrophic lateral sclerosis: Secondary | ICD-10-CM | POA: Diagnosis not present

## 2015-08-26 LAB — PROTIME-INR
INR: 1.34 (ref 0.00–1.49)
PROTHROMBIN TIME: 16.7 s — AB (ref 11.6–15.2)

## 2015-08-26 NOTE — Consult Note (Unsigned)
NAMFranne Grip:  Fabiano, Valentine             ACCOUNT NO.:  [id number]  MEDICAL RECORD NO.:  [id number]  LOCATION:                               FACILITY:  [facility]  PHYSICIAN:  Kristine GarbeChristopher E. Ezzard StandingNewman, M.D.DATE OF BIRTH:  02/06/1956  DATE OF CONSULTATION:  08/25/2015 DATE OF DISCHARGE:                                CONSULTATION   REASON FOR CONSULT:  To evaluate the patient with recent epistaxis.  BRIEF HISTORY:  Karver Roanna EpleyGarland is a 59 year old male with history of acute on chronic respiratory failure requiring a tracheotomy at an outside hospital.  The patient has a history of ALS complicated by recent PE.  The patient was [facility] to [facility] July 27, 2015, for trach weaning.  The patient presently has a trach in place, off the vent.  He recently underwent a bronchoscopy and at the time of bronchoscopy, it was noted to have a postnasal bleeding more from the right side.  This was able to be stopped with packing soaked with epinephrine on the right nostril.  I have subsequently consulted to evaluate recent epistaxis.  On examination, oral exam reveals a contused left anterolateral tongue.  There was no active bleeding noted.  In the posterior oropharyngeal cavity, he had a small amount of blood clot extending from the posterior nasal cavity on the left side, but no active bleeding noted.  On the nasal exam, nasal passages were clear anteriorly bilaterally.  There is no evidence of recent bleeding from the septum or anteriorly.  Nasal endoscopy was performed at bedside.  On nasal endoscopy, the right side was clear, there were no masses, no evidence of neoplasm.  He has minimal blood in the posterior nasal cavity, but no active bleeding noted.  On the left side, again there were no masses and no evidence of neoplasm. Nasopharynx was clear; however, there was a blood clot posteriorly on the left side high against the septum, but no active bleeding was  noted.  IMPRESSION:  Presently no active bleeding noted on exam today, although on nasal endoscopy, it appears he had blood clot or possible bleeding from the posterior left nasal cavity.  RECOMMENDATION:  No further intervention is needed unless he has further bleeding.  If he has any further bleeding, would place an anterior- posterior nasal pack on the site that bleeds, either Merocel pack Rhino rocket and leave in place for 4 or 5 days.  I can follow up with the patient p.r.n. any further bleeding.          ______________________________ Kristine Garbehristopher E. Ezzard StandingNewman, M.D.     CEN/MEDQ  D:  08/25/2015  T:  08/25/2015  Job:  [id number]  cc:   Kristine Garbehristopher E. [facility], M.D.

## 2015-08-26 NOTE — Progress Notes (Signed)
PCCM CONSULT NOTE  ADMISSION DATE: 07/27/2015 CONSULT DATE: 07/30/2015 REFERRING PROVIDER: Dr. Sharyon MedicusHijazi  CC: Respiratory failure  HPI: 59 yo male [facility].  [facility].  He also has hx of giant cell myocarditis s/p heart transplant in 2011.  He was scheduled for PEG.  He developed respiratory failure after procedure.  He was intubated, but failed to wean from vent.  He subsequently had tracheostomy.  He was treated for PNA with GPC in culture.  [facility]   10/17- bleeding noted naose, packed 10/17 ENT assessment, left post nasal wall clot  SUBJECTIVE: No bleeding  OBJECTIVE: 112/82 24 98 f, 100 sats 78 General: comfortable on vent HEENT: trache clean, dry, intact, no blood from nose or trach Cardiac: RRR, no mgr Chest: clear Abd: soft, BS+, PEG in place Ext: no edema, muscular atrophy noted Neuro: Alert, follows commands, answering questions Skin: no obvious breakdown   CMP Latest Ref Rng 08/23/2015 08/15/2015 08/07/2015  Glucose 65 - 99 mg/dL 97 161(W110(H) 960(A116(H)  BUN 6 - 20 mg/dL 14 16 19   Creatinine 0.61 - 1.24 mg/dL <5.40(J<0.30(L) 8.11(B0.39(L) 1.47(W0.41(L)  Sodium 135 - 145 mmol/L 136 137 138  Potassium 3.5 - 5.1 mmol/L 4.3 4.4 4.1  Chloride 101 - 111 mmol/L 100(L) 97(L) 103  CO2 22 - 32 mmol/L 28 31 27   Calcium 8.9 - 10.3 mg/dL 9.1 9.6 9.4  Total Protein 6.5 - 8.1 g/dL - - -  Total Bilirubin 0.3 - 1.2 mg/dL - - -  Alkaline Phos 38 - 126 U/L - - -  AST 15 - 41 U/L - - -  ALT 17 - 63 U/L - - -     CBC Latest Ref Rng 08/25/2015 08/24/2015 08/24/2015  WBC 4.0 - 10.5 K/uL 8.6 6.1 -  Hemoglobin 13.0 - 17.0 g/dL 10.0(L) 6.5(LL) -  Hematocrit 39.0 - 52.0 % 30.2(L) 20.3(L) 19.8(L)  Platelets 150 - 400 K/uL 504(H) 526(H) -     No results found.   CULTURES: C diff 9/20 > negative  STUDIES:  EVENTS: 9/19 [facility] HCAP treatment has completed. (took off problem list). Attempting  ATC. 9/29 only lasted 5 minutes on ATC 10/10 > 12 hours PSV yesterday 10/17: FOB for bleeding from trach - beeding posterior nose 10/17- ENT scope, clot formed left post nose  DISCUSSION: 59 yo male with acute on chronic respiratory failure in setting of ALS complicated by recent PE and HCAP.  Also has hx of giant cell myocarditis s/p heart transplant. S/p nasal bleeding with associated coagulapthy  ASSESSMENT/PLAN:  Acute on chronic respiratory failure with failure to wean from vent s/p tracheostomy. He will need vent permanently Plan: - unable to wean , ALS progressed -lung clear now that bleeding from above improved  Hx of PE. Plan: - anticoagulation held for bleeding, need restart in am with heparin , NOT lovenox (unable to reverse this drug), without bolus , if no bleeding and hct stable  Mcarthur Rossettianiel J. Tyson AliasFeinstein, MD, FACP Pgr: [phone] [place] [facility]

## 2015-08-27 LAB — CBC WITH DIFFERENTIAL/PLATELET
BASOS PCT: 0 %
Basophils Absolute: 0 10*3/uL (ref 0.0–0.1)
EOS ABS: 0.2 10*3/uL (ref 0.0–0.7)
EOS PCT: 3 %
HCT: 32.5 % — ABNORMAL LOW (ref 39.0–52.0)
HEMOGLOBIN: 10.1 g/dL — AB (ref 13.0–17.0)
Lymphocytes Relative: 8 %
Lymphs Abs: 0.7 10*3/uL (ref 0.7–4.0)
MCH: 26.2 pg (ref 26.0–34.0)
MCHC: 31.1 g/dL (ref 30.0–36.0)
MCV: 84.4 fL (ref 78.0–100.0)
Monocytes Absolute: 0.7 10*3/uL (ref 0.1–1.0)
Monocytes Relative: 8 %
NEUTROS PCT: 81 %
Neutro Abs: 7.1 10*3/uL (ref 1.7–7.7)
PLATELETS: 567 10*3/uL — AB (ref 150–400)
RBC: 3.85 MIL/uL — AB (ref 4.22–5.81)
RDW: 15.8 % — ABNORMAL HIGH (ref 11.5–15.5)
WBC: 8.8 10*3/uL (ref 4.0–10.5)

## 2015-08-27 LAB — PROTIME-INR
INR: 1.31 (ref 0.00–1.49)
PROTHROMBIN TIME: 16.4 s — AB (ref 11.6–15.2)

## 2015-08-27 LAB — BASIC METABOLIC PANEL
Anion gap: 6 (ref 5–15)
BUN: 17 mg/dL (ref 6–20)
CHLORIDE: 102 mmol/L (ref 101–111)
CO2: 29 mmol/L (ref 22–32)
Calcium: 9.6 mg/dL (ref 8.9–10.3)
Glucose, Bld: 98 mg/dL (ref 65–99)
POTASSIUM: 4.6 mmol/L (ref 3.5–5.1)
SODIUM: 137 mmol/L (ref 135–145)

## 2015-08-28 LAB — PROTIME-INR
INR: 1.16 (ref 0.00–1.49)
PROTHROMBIN TIME: 15 s (ref 11.6–15.2)

## 2015-08-29 LAB — PROTIME-INR
INR: 1.26 (ref 0.00–1.49)
PROTHROMBIN TIME: 16 s — AB (ref 11.6–15.2)

## 2015-08-30 LAB — PROTIME-INR
INR: 1.23 (ref 0.00–1.49)
Prothrombin Time: 15.7 seconds — ABNORMAL HIGH (ref 11.6–15.2)

## 2015-08-31 DIAGNOSIS — J9610 Chronic respiratory failure, unspecified whether with hypoxia or hypercapnia: Secondary | ICD-10-CM

## 2015-08-31 LAB — PROTIME-INR
INR: 1.27 (ref 0.00–1.49)
Prothrombin Time: 16.1 seconds — ABNORMAL HIGH (ref 11.6–15.2)

## 2015-08-31 NOTE — Progress Notes (Signed)
Error/duplicate   Reginald MartinetPeter E Krueger ACNP-BC [facility] Pulmonary/Critical Care Pager # [phone] OR # [phone] if no answer

## 2015-09-01 LAB — PROTIME-INR
INR: 1.21 (ref 0.00–1.49)
Prothrombin Time: 15.5 seconds — ABNORMAL HIGH (ref 11.6–15.2)

## 2015-09-02 LAB — PROTIME-INR
INR: 1.33 (ref 0.00–1.49)
PROTHROMBIN TIME: 16.6 s — AB (ref 11.6–15.2)

## 2015-09-03 LAB — PROTIME-INR
INR: 1.3 (ref 0.00–1.49)
PROTHROMBIN TIME: 16.3 s — AB (ref 11.6–15.2)

## 2016-01-29 ENCOUNTER — Institutional Professional Consult (permissible substitution): Admission: AD | Admit: 2016-01-29 | Payer: Medicare Other | Source: Ambulatory Visit | Admitting: Internal Medicine

## 2016-05-13 ENCOUNTER — Emergency Department (HOSPITAL_COMMUNITY)
Admission: EM | Admit: 2016-05-13 | Discharge: 2016-06-07 | Disposition: E | Payer: Medicare Other | Attending: Emergency Medicine | Admitting: Emergency Medicine

## 2016-05-13 ENCOUNTER — Emergency Department (HOSPITAL_COMMUNITY): Payer: Medicare Other

## 2016-05-13 ENCOUNTER — Encounter (HOSPITAL_COMMUNITY): Payer: Self-pay | Admitting: *Deleted

## 2016-05-13 DIAGNOSIS — I4901 Ventricular fibrillation: Secondary | ICD-10-CM | POA: Diagnosis not present

## 2016-05-13 DIAGNOSIS — J9621 Acute and chronic respiratory failure with hypoxia: Secondary | ICD-10-CM | POA: Diagnosis not present

## 2016-05-13 DIAGNOSIS — Z7982 Long term (current) use of aspirin: Secondary | ICD-10-CM | POA: Insufficient documentation

## 2016-05-13 DIAGNOSIS — R0902 Hypoxemia: Secondary | ICD-10-CM

## 2016-05-13 DIAGNOSIS — Z941 Heart transplant status: Secondary | ICD-10-CM | POA: Diagnosis not present

## 2016-05-13 DIAGNOSIS — J690 Pneumonitis due to inhalation of food and vomit: Secondary | ICD-10-CM | POA: Insufficient documentation

## 2016-05-13 DIAGNOSIS — R0600 Dyspnea, unspecified: Secondary | ICD-10-CM | POA: Diagnosis present

## 2016-05-13 DIAGNOSIS — I472 Ventricular tachycardia, unspecified: Secondary | ICD-10-CM

## 2016-05-13 DIAGNOSIS — Z79899 Other long term (current) drug therapy: Secondary | ICD-10-CM | POA: Diagnosis not present

## 2016-05-13 DIAGNOSIS — I509 Heart failure, unspecified: Secondary | ICD-10-CM | POA: Diagnosis not present

## 2016-05-13 DIAGNOSIS — I110 Hypertensive heart disease with heart failure: Secondary | ICD-10-CM | POA: Insufficient documentation

## 2016-05-13 LAB — COMPREHENSIVE METABOLIC PANEL
ALK PHOS: 122 U/L (ref 38–126)
ALT: 24 U/L (ref 17–63)
AST: 47 U/L — ABNORMAL HIGH (ref 15–41)
Albumin: 1.7 g/dL — ABNORMAL LOW (ref 3.5–5.0)
Anion gap: 9 (ref 5–15)
BUN: 78 mg/dL — AB (ref 6–20)
CALCIUM: 12.1 mg/dL — AB (ref 8.9–10.3)
CO2: 29 mmol/L (ref 22–32)
CREATININE: 0.53 mg/dL — AB (ref 0.61–1.24)
Chloride: 83 mmol/L — ABNORMAL LOW (ref 101–111)
Glucose, Bld: 93 mg/dL (ref 65–99)
Potassium: 4.4 mmol/L (ref 3.5–5.1)
Sodium: 121 mmol/L — ABNORMAL LOW (ref 135–145)
Total Bilirubin: 0.4 mg/dL (ref 0.3–1.2)
Total Protein: 7.2 g/dL (ref 6.5–8.1)

## 2016-05-13 LAB — CBC WITH DIFFERENTIAL/PLATELET
BASOS PCT: 0 %
Basophils Absolute: 0 10*3/uL (ref 0.0–0.1)
Eosinophils Absolute: 0 10*3/uL (ref 0.0–0.7)
Eosinophils Relative: 0 %
HEMATOCRIT: 22.7 % — AB (ref 39.0–52.0)
HEMOGLOBIN: 6.4 g/dL — AB (ref 13.0–17.0)
LYMPHS ABS: 1.1 10*3/uL (ref 0.7–4.0)
Lymphocytes Relative: 7 %
MCH: 23.9 pg — AB (ref 26.0–34.0)
MCHC: 28.2 g/dL — AB (ref 30.0–36.0)
MCV: 84.7 fL (ref 78.0–100.0)
MONOS PCT: 6 %
Monocytes Absolute: 1 10*3/uL (ref 0.1–1.0)
Neutro Abs: 14.1 10*3/uL — ABNORMAL HIGH (ref 1.7–7.7)
Neutrophils Relative %: 87 %
Platelets: 359 10*3/uL (ref 150–400)
RBC: 2.68 MIL/uL — ABNORMAL LOW (ref 4.22–5.81)
RDW: 19 % — ABNORMAL HIGH (ref 11.5–15.5)
WBC: 16.2 10*3/uL — ABNORMAL HIGH (ref 4.0–10.5)

## 2016-05-13 LAB — I-STAT CG4 LACTIC ACID, ED: LACTIC ACID, VENOUS: 2.45 mmol/L — AB (ref 0.5–1.9)

## 2016-05-18 LAB — CULTURE, BLOOD (ROUTINE X 2)
CULTURE: NO GROWTH
Culture: NO GROWTH

## 2016-06-07 NOTE — Progress Notes (Signed)
   06-11-2016 0319  Clinical Encounter Type  Visited With Patient and family together;Health care provider  Visit Type Initial;Death;ED  Referral From Nurse  Stress Factors  Family Stress Factors Loss;Health changes   Chaplain responded to patient's death. Chaplain met with patient's mom and siblings. Patient's family wishes for the patient to go to [facility] in [place]. Chaplain relayed that information to the patient's RN, as well as mom's contact information ([address] [place] [address]). Spiritual care services available as needed.   Jeri Lager, Chaplain 2016-06-11 3:21 AM

## 2016-06-07 NOTE — ED Notes (Signed)
Post mortem care performed.

## 2016-06-07 NOTE — ED Notes (Signed)
Family at bedside with Dr Patria Maneampos.

## 2016-06-07 NOTE — ED Notes (Signed)
[facility]. Pt has hx of ALS and possible heart transport. Pt has been experiencing respiratory distress and requiring higher ventilator settings x2 days. Facility reported suctioning what looked like tube feeding out of his trache. Pt was sating in 70's per nursing facility.

## 2016-06-07 NOTE — ED Provider Notes (Signed)
CSN: [id number]     Arrival date & time 05/23/2016  0202 History  By signing my name below, I, Doreatha MartinEva Mathews, attest that this documentation has been prepared under the direction and in the presence of Azalia BilisKevin Nykia Turko, MD. Electronically Signed: Doreatha MartinEva Mathews, ED Scribe. 2016-03-25. 2:42 AM.     Chief Complaint  Patient presents with  . Respiratory Distress   LEVEL 5 CAVEAT: HPI and ROS limited due to pt condition   The history is provided by the EMS personnel, medical records, the nursing home and a relative. The history is limited by the condition of the patient. No language interpreter was used.   HPI Comments: [name] is a 60 y.o. male with h/o myocarditis, CHF, heart transplant in 2011, ALS, brought in by ambulance from SNF, who presents to the Emergency Department d/t declining respiratory status for 2 days that worsened last night. Per EMS, staff at SNF noticed a decline in the pts respiratory status for that past 2 days and were unable to maintain his O2 sats by increasing O2 to 12L. EMS states staff then bagged the pt and were able to increase his spO2 to 100%. His blood pH was 7.1 at his facility per EMS. EMS also reports that staff noticed a tube feed-like substance from the pts nose and mouth last night. EMS states that on their arrival, his end tidal volume was 35 L, pupils were brisk and his spO2 was 80%. EMS notes they switched the pt to air vent and the pt showed improvement. EMS placed IO to the RLE. Per EMS, pt is responsive only to pain at baseline.   On family's arrival, they request full code status per pts wishes. Per family, pt was doing well s/p heart transplant before rapid progression of his ALS. They state he has not been able to make decisions for himself for at least a few months.   Past Medical History  Diagnosis Date  . ALS (amyotrophic lateral sclerosis) (HCC)   . Heart transplanted (HCC)   . Hypertension   . CHF (congestive heart failure) [facility](HCC)    Past Surgical  History  Procedure Laterality Date  . Heart transplant    . Orchiectomy      L sided   No family history on file. Social History  Substance Use Topics  . Smoking status: Never Smoker   . Smokeless tobacco: Never Used  . Alcohol Use: No    Review of Systems  Unable to perform ROS: Acuity of condition   Allergies  Review of patient's allergies indicates no known allergies.  Home Medications   Prior to Admission medications   Medication Sig Start Date End Date Taking? Authorizing Provider  aspirin 81 MG tablet Take 81 mg by mouth every morning.    Historical Provider, MD  bismuth subsalicylate (PEPTO BISMOL) 262 MG/15ML suspension Take 30 mLs by mouth every 6 (six) hours as needed for diarrhea or loose stools.     Historical Provider, MD  Calcium Carb-Cholecalciferol (CALCIUM 600/VITAMIN D3) 600-800 MG-UNIT TABS Take 1 tablet by mouth 2 (two) times daily.    Historical Provider, MD  cetirizine (ZYRTEC) 10 MG tablet Take 10 mg by mouth as needed for allergies.     Historical Provider, MD  diphenhydramine-acetaminophen (TYLENOL PM) 25-500 MG TABS Take 1 tablet by mouth at bedtime as needed (for sleep).     Historical Provider, MD  lisinopril (PRINIVIL,ZESTRIL) 5 MG tablet Take 2.5 mg by mouth every morning. 03/23/15   Historical Provider,  MD  magnesium oxide (MAG-OX) 400 MG tablet Take 800 mg by mouth 3 (three) times daily. 10/13/14   Historical Provider, MD  mycophenolate (CELLCEPT) 500 MG tablet Take 500 mg by mouth 2 (two) times daily. 01/27/15   Historical Provider, MD  pravastatin (PRAVACHOL) 20 MG tablet Take 20 mg by mouth every evening. 10/09/14   Historical Provider, MD  riluzole (RILUTEK) 50 MG tablet Take 50 mg by mouth 2 (two) times daily. 04/16/15   Historical Provider, MD  tacrolimus (PROGRAF) 1 MG capsule Take 1.5 mg by mouth 2 (two) times daily.    Historical Provider, MD   BP 34/19 mmHg  Pulse 122  Temp(Src) 97.3 F (36.3 C) (Axillary)  Resp 19  SpO2 16% Physical Exam   Constitutional:  Chronically-ill appearing.   HENT:  Head: Normocephalic and atraumatic.  Eyes: EOM are normal.  Neck:  Tracheostomy tube in place without surrounding signs of infection  Cardiovascular: Regular rhythm and normal heart sounds.  Tachycardia present.   Pulmonary/Chest: He is in respiratory distress.  Coarse breath sounds bilaterally.   Abdominal: Soft. He exhibits no distension. There is no tenderness.  G-tube LUQ  Musculoskeletal: Normal range of motion.  Neurological: He is alert.  Skin: Skin is warm and dry.  Nursing note and vitals reviewed.   ED Course  Procedures (including critical care time) DIAGNOSTIC STUDIES: Oxygen Saturation is 87% on vent, low by my interpretation.    COORDINATION OF CARE: 2:33 AM Ordered CXR, lab work. Discussed care plan with family, which includes admission, and family agrees to plan.   2:39 AM Pt in VT Vfib. Discussed defibrillation with family. After shared decision making they opt to forgo further treatment,    CRITICAL CARE Performed by: Azalia BilisKevin Granite Godman, MD  Total critical care time: 45 minutes Critical care time was exclusive of separately billable procedures and treating other patients. Critical care was necessary to treat or prevent imminent or life-threatening deterioration. Critical care was time spent personally by me on the following activities: development of treatment plan with patient and/or surrogate as well as nursing, discussions with consultants, evaluation of patient's response to treatment, examination of patient, obtaining history from patient or surrogate, ordering and performing treatments and interventions, ordering and review of laboratory studies, ordering and review of radiographic studies, pulse oximetry and re-evaluation of patient's condition.    Labs Review Labs Reviewed  COMPREHENSIVE METABOLIC PANEL - Abnormal; Notable for the following:    Sodium 121 (*)    Chloride 83 (*)    BUN 78 (*)     Creatinine, Ser 0.53 (*)    Calcium 12.1 (*)    Albumin 1.7 (*)    AST 47 (*)    All other components within normal limits  CBC WITH DIFFERENTIAL/PLATELET - Abnormal; Notable for the following:    WBC 16.2 (*)    RBC 2.68 (*)    Hemoglobin 6.4 (*)    HCT 22.7 (*)    MCH 23.9 (*)    MCHC 28.2 (*)    RDW 19.0 (*)    Neutro Abs 14.1 (*)    All other components within normal limits  I-STAT CG4 LACTIC ACID, ED - Abnormal; Notable for the following:    Lactic Acid, Venous 2.45 (*)    All other components within normal limits  CULTURE, BLOOD (ROUTINE X 2)  CULTURE, BLOOD (ROUTINE X 2)  URINE CULTURE  URINALYSIS, ROUTINE W REFLEX MICROSCOPIC (NOT AT [facility])  I-STAT CG4 LACTIC ACID, ED  Imaging Review Dg Chest Portable 1 View  05/31/16  CLINICAL DATA:  Shortness of breath tonight. EXAM: PORTABLE CHEST 1 VIEW COMPARISON:  08/20/1959 FINDINGS: Tracheostomy tube at the thoracic inlet. Post median sternotomy. Dense right upper lobe consolidation with probable air bronchograms. Additional patchy opacities throughout the remainder of both lungs. Hazy opacity at both lung bases suspicious for pleural effusions. The heart is unchanged in size. Vascular congestion. No pneumothorax. IMPRESSION: Bilateral patchy opacities throughout both lungs, may be infectious or pulmonary edema. More confluent right upper lobe opacity is suspicious for pneumonia. Hazy opacity at the lung bases most likely pleural effusions. Recommend continued radiographic follow-up. Electronically Signed   By: Rubye Oaks M.D.   On: 05/31/16 02:39   I have personally reviewed and evaluated these images and lab results as part of my medical decision-making.   EKG Interpretation   Date/Time:  Friday 2016-05-31 02:18:43 EDT Ventricular Rate:  97 PR Interval:    QRS Duration: 91 QT Interval:  377 QTC Calculation: 479 R Axis:   68 Text Interpretation:  Sinus rhythm Borderline short PR interval Abnormal  R-wave  progression, early transition Borderline repol abnrm, anterolateral  leads Borderline prolonged QT interval No significant change was found  Confirmed by Dougles Kimmey  MD, Lizania Bouchard ([id number]) on 05/31/2016 5:53:48 AM          MDM   Final diagnoses:  Aspiration pneumonia of both lungs, unspecified aspiration pneumonia type, unspecified part of lung (HCC)  Hypoxia  Ventricular fibrillation (HCC)  Ventricular tachycardia (HCC)  Acute on chronic respiratory failure with hypoxia [place])    Patient sent to the emergency department from the long-term care facility for progressive hypoxia and increasing ventilator needs. Chest x-ray is consistent with significant bilateral infiltrates likely aspiration given the history of G-tube feeds noted coming from his nose and mouth. In the emergency department the patient had a rapid progression of his worsening hypoxemia despite maximal the laser settings. Family was at the bedside a long discussion in regards to the patient. The patient has had progressive ALS over the posterior with increasing decline. Despite IV fluids and maximal the later settings the patient continued to develop worsening hypoxic respiratory failure (course of approximately 45 minutes. The patient did develop ventricular tachycardia and V. fib and family elected to not perform defibrillation at that time. For family palliative morphine for air hunger but they did not think the patient needed. The patient passed peacefully in the emergency department with his sister and parents at the bedside. Chaplain was called. I personally signed the death certificate. Time of death 2:40 AM   Time of death 2:40 AM.     I personally performed the services described in this documentation, which was scribed in my presence. The recorded information has been reviewed and is accurate.       Azalia Bilis, MD 05-31-16 [phone]

## 2016-06-07 NOTE — ED Notes (Signed)
Pt has had a rhythm change -vfib. Dr Patria Maneampos at  Bedside with family explaining.

## 2016-06-07 NOTE — ED Notes (Signed)
Time of death 180240 pronounced by Dr Patria Maneampos. Family present.

## 2016-06-07 DEATH — deceased

## 2016-10-14 IMAGING — CR DG CHEST 1V PORT
1 series · 1 of 1 positions shown · non-contrast
Comparison: 05/27/2015

CLINICAL DATA: Shortness of breath.  Hypoxia.

EXAM:
PORTABLE CHEST - 1 VIEW

[AP]
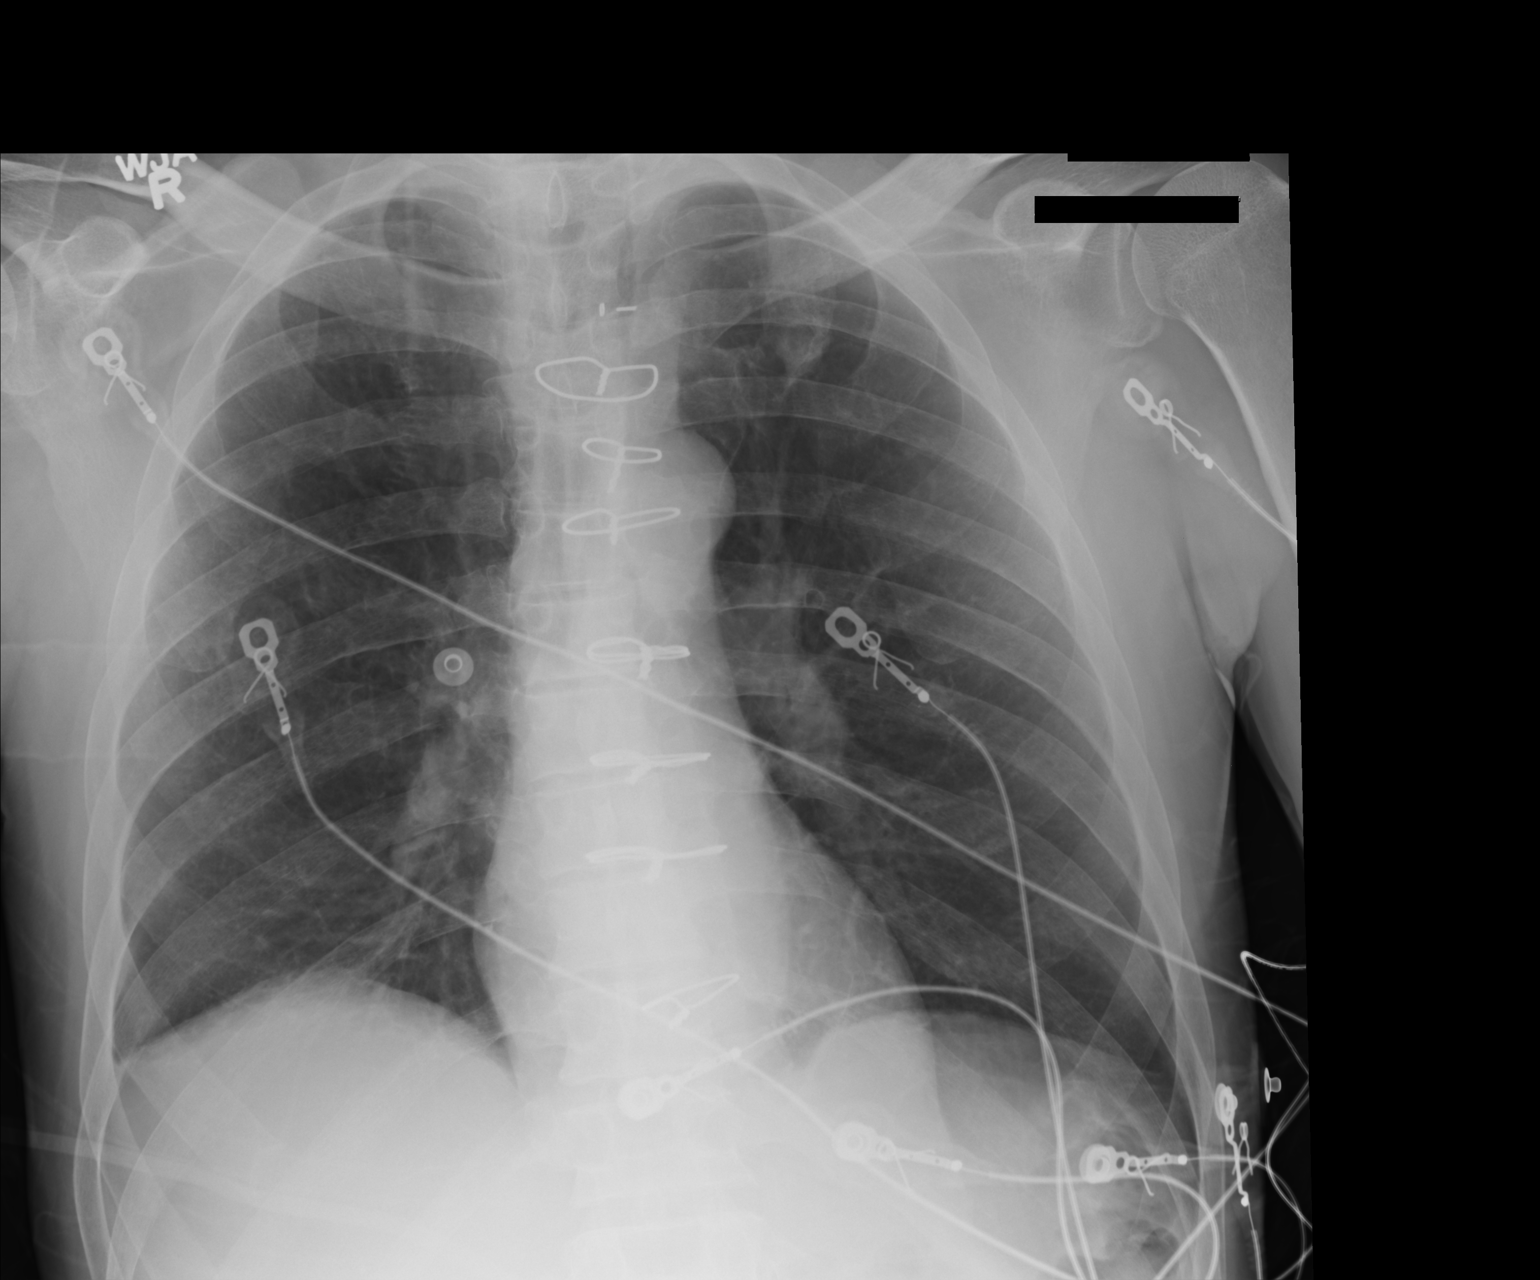

[1 of 1 positions shown; findings below may reference images not displayed]

FINDINGS: Postoperative changes in the mediastinum. Normal heart size and
pulmonary vascularity. Mediastinal contours appear intact.
Suggestion of slight linear atelectasis in the lung bases. No focal
consolidation or airspace disease in the lungs. No pneumothorax.
IMPRESSION: Suggestion of mild linear atelectasis in the lung bases. No focal
consolidation.

## 2016-11-06 IMAGING — CR DG ABD PORTABLE 1V
1 series · 1 of 1 positions shown · non-contrast
Comparison: Portable exam 4585 hr compared to 07/02/2009

CLINICAL DATA: Verification of PEG tube placement

EXAM:
PORTABLE ABDOMEN - 1 VIEW

[AP]
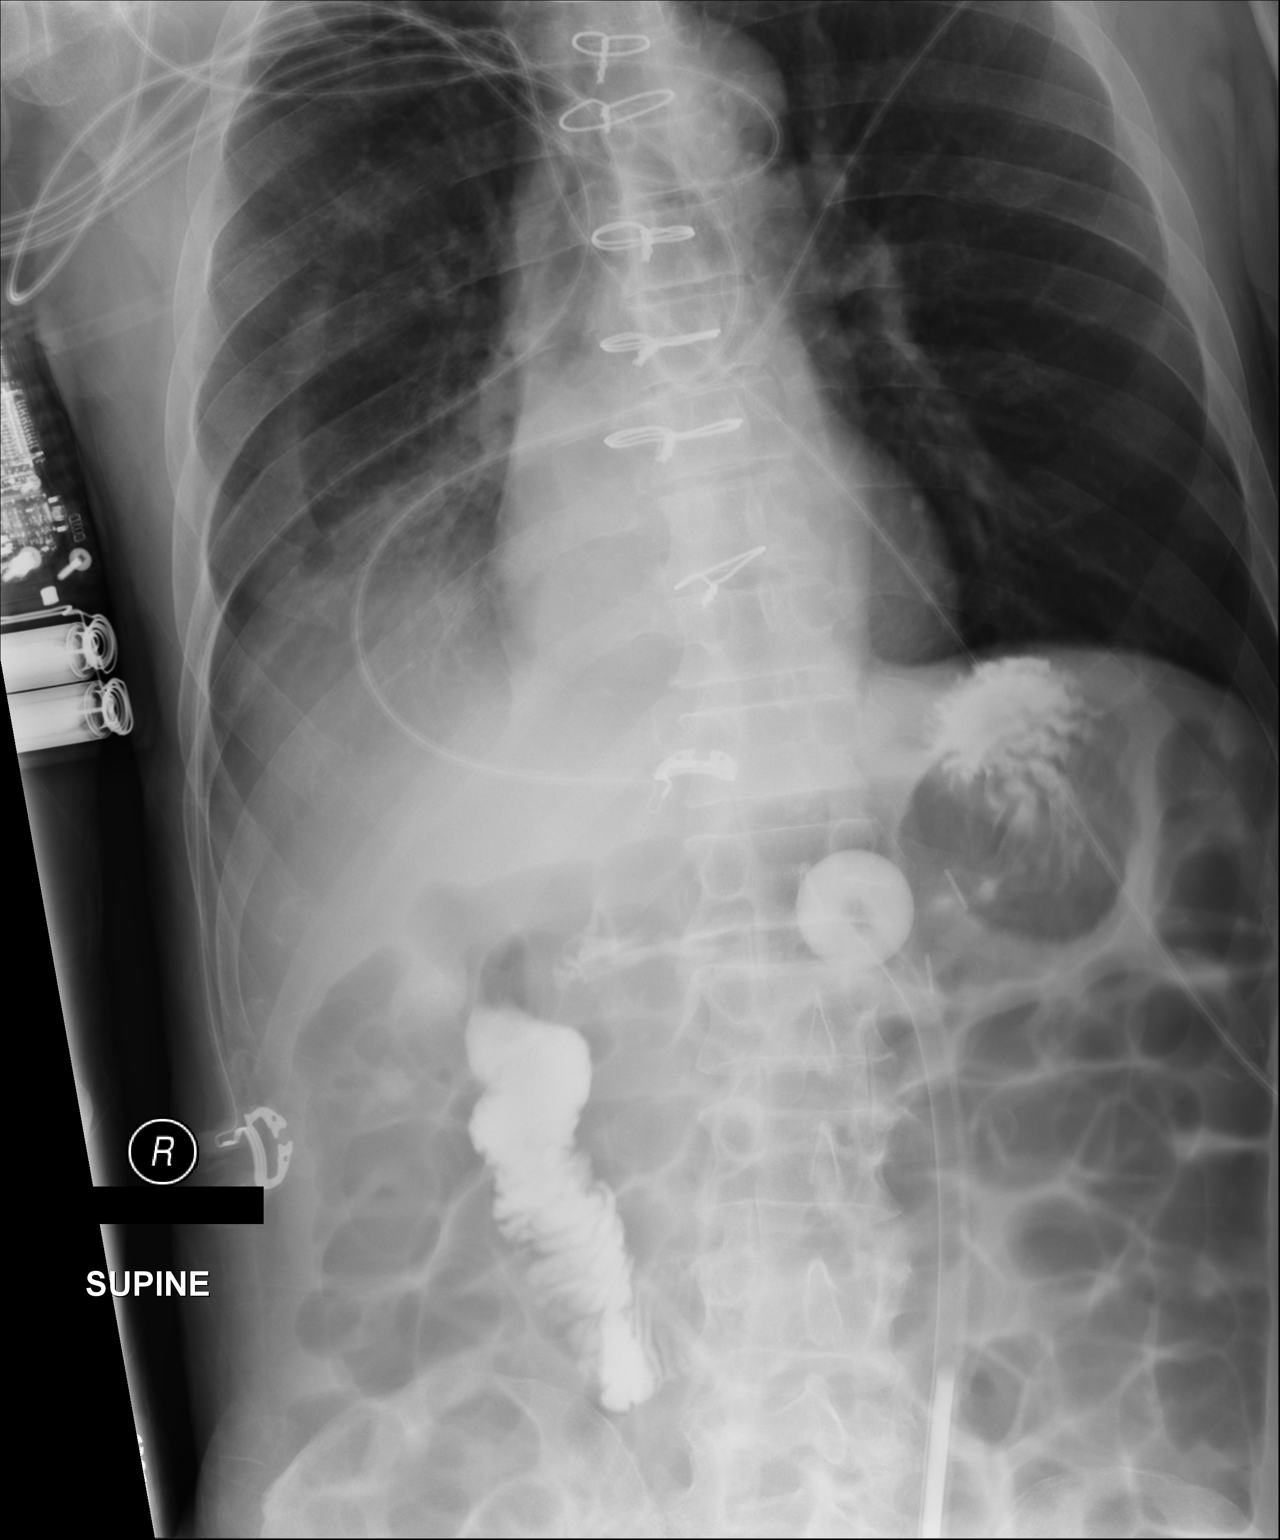

[1 of 1 positions shown; findings below may reference images not displayed]

FINDINGS: Contrast has been injected through the PEG tube at the gastric
antrum.

Contrast opacifies the gastric fundus and the descending duodenum.

No contrast extravasation identified.

Nondistended air-filled bowel loops.

LEFT lung clear.

Opacity at the RIGHT lung base may represent layered effusion in
combination with atelectasis or infiltrate.
IMPRESSION: Contrast injected through the PEG tube opacifies the gastric lumen
and duodenum without extravasation.

## 2016-11-07 IMAGING — CR DG CHEST 1V PORT
1 series · 1 of 1 positions shown · non-contrast
Comparison: 07/04/2015

CLINICAL DATA: Respiratory failure.

EXAM:
PORTABLE CHEST - 1 VIEW

[AP]
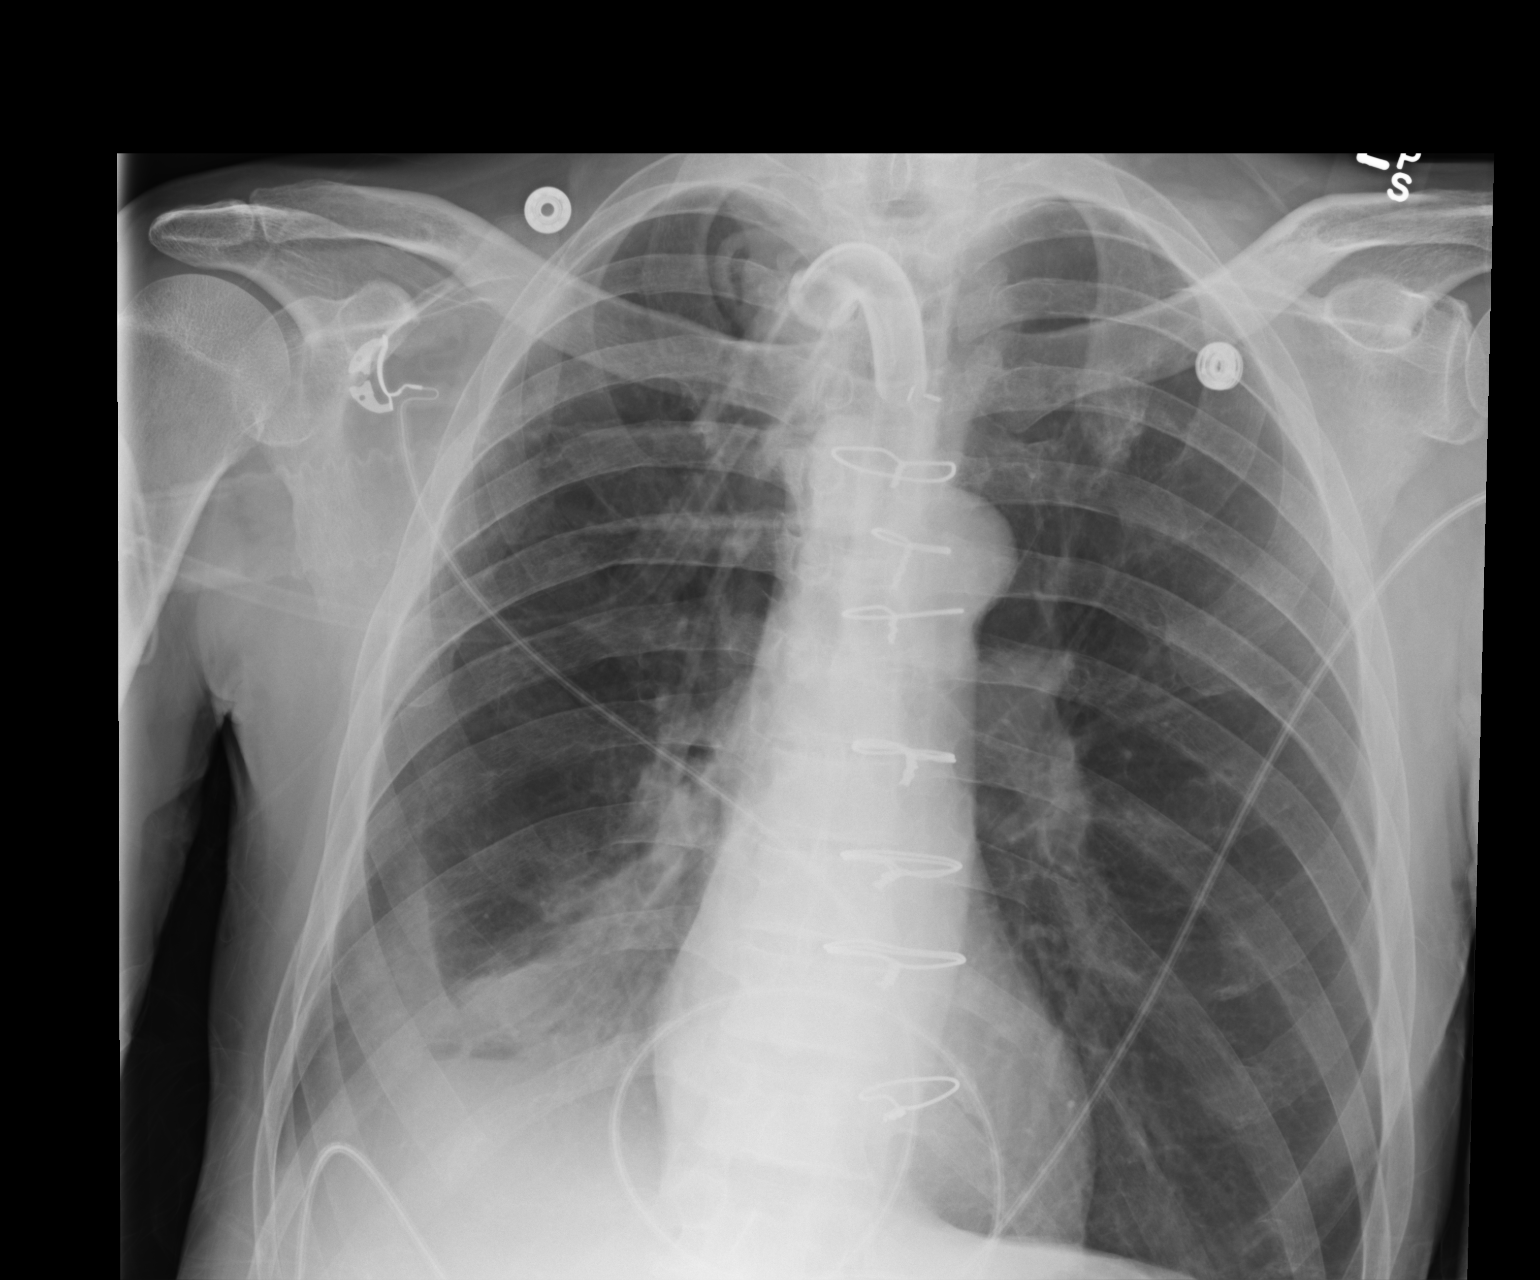

[1 of 1 positions shown; findings below may reference images not displayed]

FINDINGS: Sternotomy wires unchanged. Tracheostomy tube in adequate position.
Lungs are adequately inflated with opacification over the right base
likely small effusion with associated atelectasis. Two small
adjacent air collections over the right lung opacification with
possible air-fluid level as cannot exclude a cavitating process.
Cardiomediastinal silhouette and remainder of the exam is unchanged.
IMPRESSION: Right base opacification likely a small effusion with associated
airspace density which may be due to atelectasis, infarct or
infection. Two small adjacent air collections within this region of
opacification as cannot exclude a cavitary process. Consider PA and
lateral chest x-ray for better evaluation of the right base versus
CT.

## 2016-11-17 IMAGING — CR DG CHEST 1V PORT
1 series · 1 of 1 positions shown · non-contrast
Comparison: Portable chest x-ray August 03, 2015

CLINICAL DATA: Respiratory failure, pulmonary embolism, history of
heart transplant

EXAM:
PORTABLE CHEST 1 VIEW

[AP]
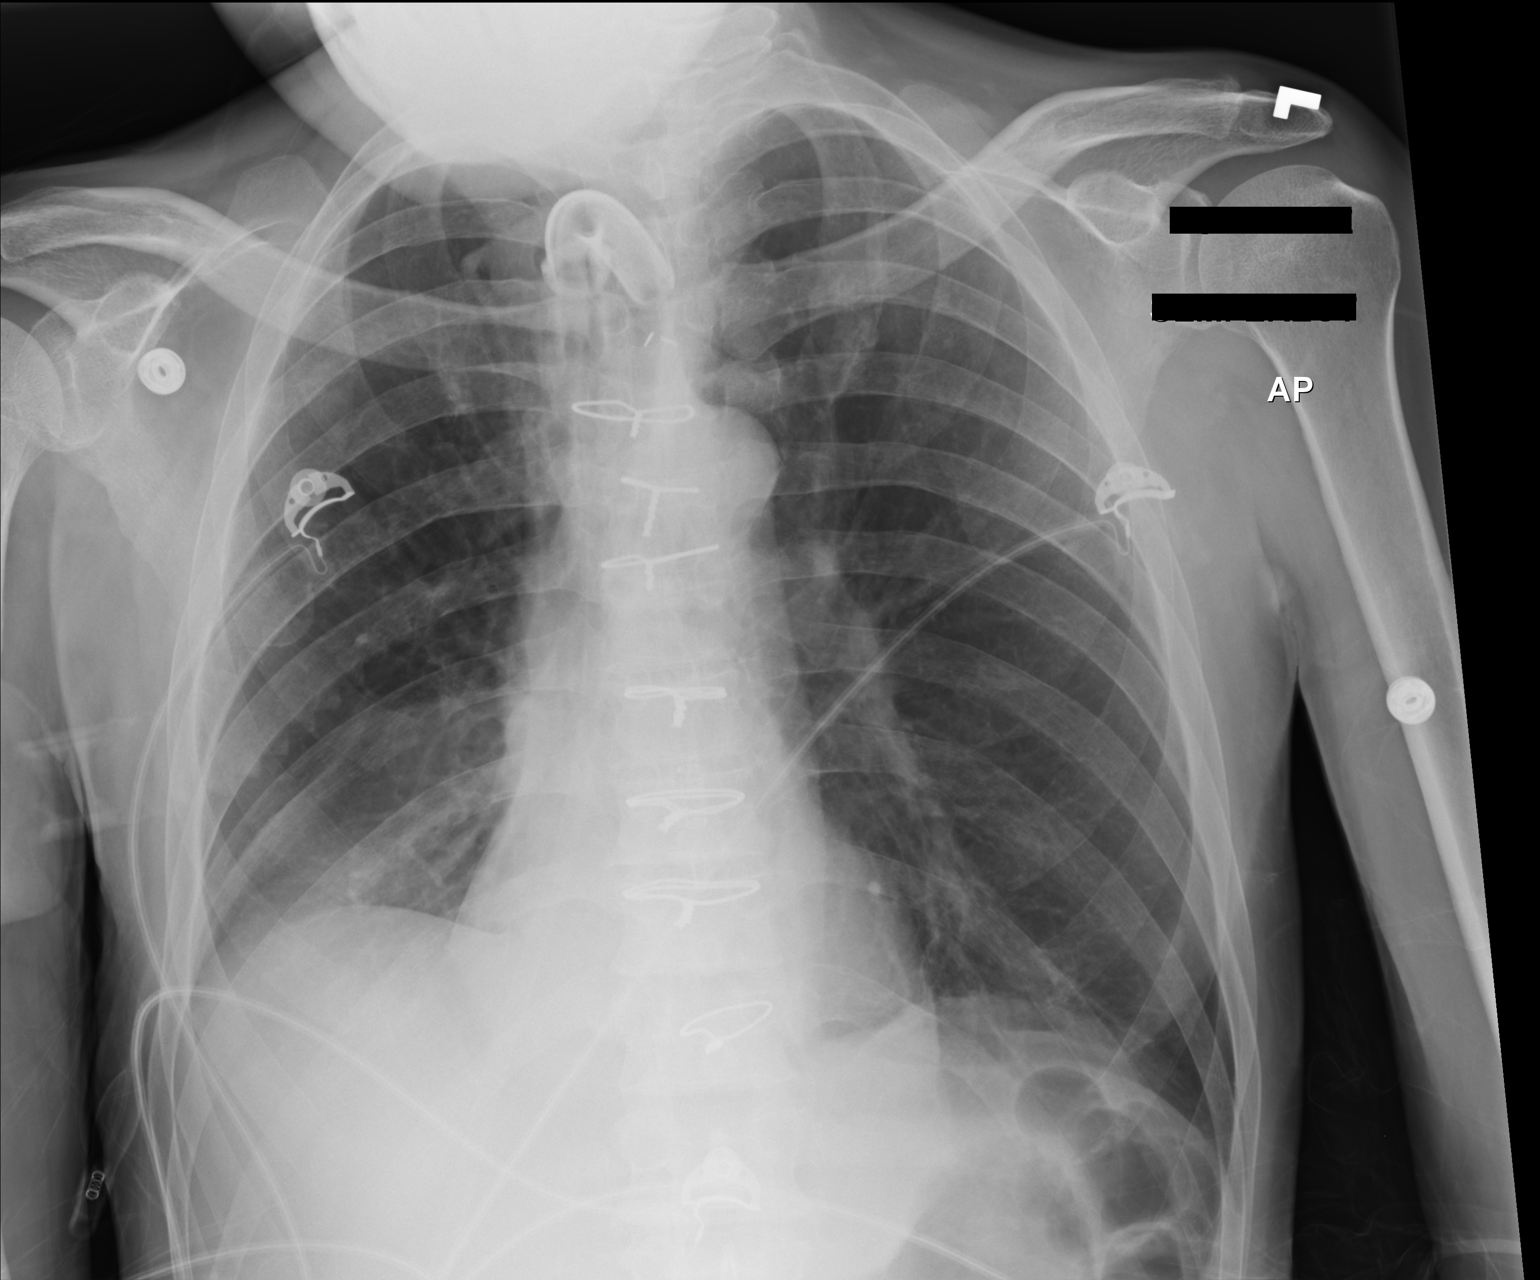

[1 of 1 positions shown; findings below may reference images not displayed]

FINDINGS: The left lung is well-expanded. There are coarse infrahilar lung
markings on the left. On the right there are increased basilar lung
markings. The heart is normal in size. The tracheostomy appliance
tip lies at the level of the superior margin of the clavicular
heads. There are 7 intact sternal wires. There is no pleural
effusion or pneumothorax.
IMPRESSION: Persistent right basilar atelectasis or pneumonia. Coarse left
infrahilar lung markings consistent with early subsegmental
atelectasis. There is no CHF.

## 2016-11-30 IMAGING — CR DG CHEST 1V PORT
1 series · 1 of 1 positions shown · non-contrast
Comparison: 08/19/2015 and earlier.

CLINICAL DATA: 59-year-old male status post PICC line placement.
Initial encounter.

EXAM:
PORTABLE CHEST 1 VIEW

[AP]
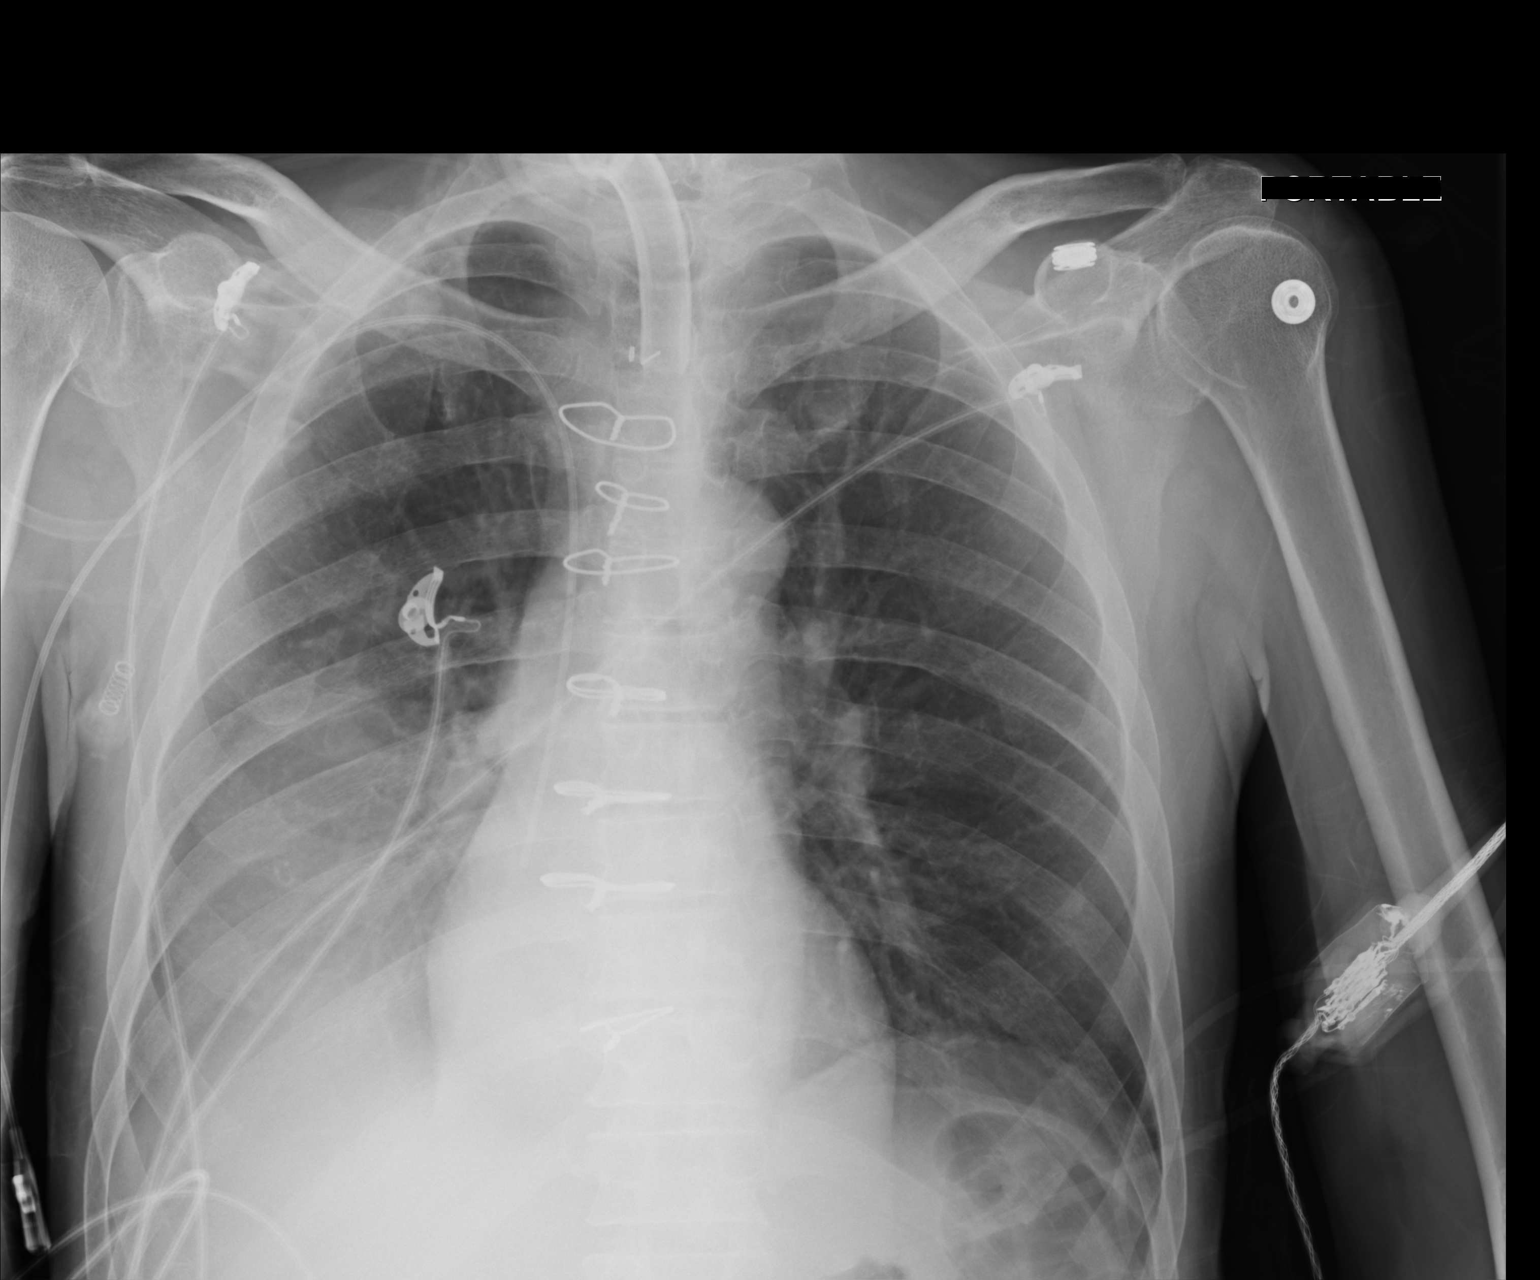

[1 of 1 positions shown; findings below may reference images not displayed]

FINDINGS: Portable AP semi upright view at 0770 hours. Right side PICC line
placed. Tip projects less than 10 mm below the cavoatrial junction
level. Stable tracheostomy tube. Stable cardiac size and mediastinal
contours. No pneumothorax. Continued veiling opacity at the right
lung base.
IMPRESSION: 1. Right PICC line placed, tip just below the cavoatrial junction
level.
2. Right pleural effusion.
